# Patient Record
Sex: Female | Born: 1990 | Hispanic: Yes | Marital: Married | Smoking: Never smoker
Health system: Southern US, Community
[De-identification: ages and names within clinical notes are randomized; demographics above are authoritative.]

## PROBLEM LIST (undated history)

## (undated) ENCOUNTER — Inpatient Hospital Stay (HOSPITAL_COMMUNITY): Payer: Self-pay

---
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
## 2011-02-13 NOTE — Discharge Summary (Signed)
NAME:  Anna Perry, Anna Perry      ACCOUNT NO.:  [id number]   MEDICAL RECORD NO.:  [id number]          PATIENT TYPE:  INP   LOCATION:  9147                          FACILITY:  [facility]   PHYSICIAN:  Charles A. Clearance Coots, M.D.DATE OF BIRTH:  November 03, 1990   DATE OF ADMISSION:  06/08/2008  DATE OF DISCHARGE:  06/12/2008                               DISCHARGE SUMMARY   ADMITTING DIAGNOSIS:  Term pregnancy, early labor.   DISCHARGE DIAGNOSES:  1. Term pregnancy, early labor.  2. Status post primary low transverse cesarean section for arrest of      descent, deep transverse arrest, persistent occiput posterior      position, viable female delivered on June 09, 2008, at 1460,      Apgars of 8 at 1 minute and 9 at 5 minutes, weight of 3420 g,      length of 54.61 cm.  3. Mother and infant discharged home in good condition.   REASON FOR ADMISSION:  A 20 year old gravida 1 presented with  spontaneous uterine contractions.   PAST MEDICAL HISTORY:   SURGERY:  None.   ILLNESSES:  None.   MEDICATIONS:  Prenatal vitamins.   ALLERGIES:  No known drug allergies.   SOCIAL HISTORY:  Single.  Negative tobacco, alcohol, or recreational  drug use.   FAMILY HISTORY:  No major diseases.   PHYSICAL EXAMINATION:  GENERAL:  Well-nourished, well-developed female  in no acute distress.  Afebrile.  VITAL SIGNS:  Stable.  LUNGS:  Clear to auscultation bilaterally.  HEART:  Regular rate and rhythm.  ABDOMEN:  Gravid and nontender.  Cervix 4 cm, 100% effacement, vertex at  a 0 station.   ADMITTING LABS:  Hemoglobin 10, hematocrit 30, white blood cell count  9100, and platelets 285,000.  RPR is nonreactive.   HOSPITAL COURSE:  The patient was admitted and membranes were actively  ruptured with clear fluid expelled.  The patient progressed to full  dilatation and descent to +2 station, but had no further descent with  increased caput for greater than 3 hours and decision was made to  proceed with  cesarean section delivery for arrest of descent.  Primary  low transverse cesarean section was performed on June 09, 2008.  There were no intraoperative complications.  Postoperative course was  uncomplicated.  The patient did have anemia postoperatively, but had no  clinical hemodynamic adverse changes and was able to ambulate and get  around without any headache, dizziness, or weakness.  The patient was  therefore discharged home on postop day #3 in good condition.   DISCHARGE LABS:  Hemoglobin 7.5, hematocrit 22.6, white blood cell count  13,100, and platelets 204,000.   DISCHARGE DISPOSITION:  Continue prenatal vitamins.  Iron was prescribed  for anemia.  Tylox and ibuprofen was prescribed for pain.  Routine  written instructions were given for discharge after cesarean section.  The patient is to call office for followup appointment in 2 weeks.      Charles A. Clearance Coots, M.D.  Electronically Signed     CAH/MEDQ  D:  06/12/2008  T:  06/12/2008  Job:  [id number]

## 2011-02-13 NOTE — Op Note (Signed)
NAME:  Anna Perry, Anna Perry      ACCOUNT NO.:  [id number]   MEDICAL RECORD NO.:  [id number]          PATIENT TYPE:  INP   LOCATION:  9147                          FACILITY:  [facility]   PHYSICIAN:  Roseanna Rainbow, M.D.DATE OF BIRTH:  [birth year]   DATE OF PROCEDURE:  06/09/2008  DATE OF DISCHARGE:                               OPERATIVE REPORT   PREOPERATIVE DIAGNOSES:  Intrauterine pregnancy at term, arrest of  descent, deep transverse arrest.   POSTOPERATIVE DIAGNOSIS:  Persistent occiput posterior.   PROCEDURE:  Primary low uterine flap elliptical cesarean delivery via  Pfannenstiel skin incision.   SURGEON:  Roseanna Rainbow, MD   ANESTHESIA:  Epidural.   ESTIMATED BLOOD LOSS:  600 mL.  Please note that there was blood-tinged  urine prior to the start of the procedure and this was clearing at the  end of the procedure.   COMPLICATIONS:  There was extension of the uterine incision involving  the left inferolateral margin with delivery of the infant's head.   PROCEDURE:  The patient was taken to the operating room with an IV  running and an epidural catheter in situ.  She was placed in the dorsal  supine position with a leftward tilt and prepped and draped in the usual  sterile fashion.  After a time-out had been completed, a Pfannenstiel  skin incision was then made with a scalpel.  This was carried down to  the underlying fascia with the Bovie.  The fascia was incised in the  midline.  The fascial incision was then extended bilaterally with curved  Mayo scissors.  The superior aspect of the fascial incision was tented  up and the underlying rectus muscles dissected off.  The inferior aspect  of the fascial incision was manipulated in similar fashion.  The rectus  muscles were separated in the midline.  The parietal peritoneum was  tented up and entered sharply.  This incision was then extended  superiorly and inferiorly with good visualization of the bladder.   The  bladder blade was then placed.  The vesicouterine peritoneum was tented  up and entered sharply.  This incision was then extended bilaterally and  the bladder flap was created using blunt dissection.  The bladder blade  was then replaced.  The lower uterine segment was then incised in a  transverse fashion with a scalpel.  This incision was then extended  bluntly.  The infant's head was then delivered atraumatically with  upward elevation vaginally from an assistant.  The oropharynx was  suctioned with the bulb suction.  The cord was clamped and cut.  The  infant was handed off to the awaiting neonatologist.  The placenta was  then removed.  The intrauterine cavity was then evacuated of remaining  amniotic fluid, clots, and debris with moist laparotomy sponge.  The  uterine incision was then inspected with the extension noted above  extending down laterally to the vagina.  The extension was approximately  2 cm in length.  The bladder flap again was bluntly and sharply  developed.  The extension and uterine incision were closed using a  running interlocking suture of 0 Monocryl.  A second imbricating layer  of the same suture was then placed.  Individual bleeding points were  secured with figure-of-eight sutures of the same.  The paracolic gutters  were then irrigated.  The parietal peritoneum was then reapproximated in  a running fashion using  2-0 Vicryl.  The fascia was closed in a running fashion using 2 sutures  of 0 Vicryl.  The skin was closed with staples.  At the close of the  procedure, the instrument and pack counts were said to be correct x2.  2  g of cephazolin have been given at the start of the procedure.  The  patient was taken to the PACU, awake, and in stable condition.      Roseanna Rainbow, M.D.  Electronically Signed     LAJ/MEDQ  D:  06/09/2008  T:  06/10/2008  Job:  [id number]

## 2011-06-25 LAB — POCT PREGNANCY, URINE: Preg Test, Ur: POSITIVE

## 2011-07-04 LAB — RPR: RPR Ser Ql: NONREACTIVE

## 2011-07-04 LAB — CBC
HCT: 22.6 — ABNORMAL LOW
MCHC: 33.4
MCHC: 34.2
MCV: 86.6
RBC: 2.61 — ABNORMAL LOW
RBC: 3.61 — ABNORMAL LOW
RDW: 14.3
WBC: 13.1
WBC: 9.1

## 2012-08-07 ENCOUNTER — Emergency Department (HOSPITAL_COMMUNITY)
Admission: EM | Admit: 2012-08-07 | Discharge: 2012-08-07 | Disposition: A | Discharge status: Home / Self Care | Payer: Self-pay | Source: Home / Self Care | Attending: Emergency Medicine | Admitting: Emergency Medicine

## 2012-08-07 ENCOUNTER — Encounter (HOSPITAL_COMMUNITY): Payer: Self-pay | Admitting: Emergency Medicine

## 2012-08-07 DIAGNOSIS — N730 Acute parametritis and pelvic cellulitis: Secondary | ICD-10-CM

## 2012-08-07 LAB — WET PREP, GENITAL
Trich, Wet Prep: NONE SEEN
Yeast Wet Prep HPF POC: NONE SEEN

## 2012-08-07 LAB — POCT URINALYSIS DIP (DEVICE)
Bilirubin Urine: NEGATIVE
Nitrite: NEGATIVE
Specific Gravity, Urine: 1.015 (ref 1.005–1.030)
Urobilinogen, UA: 0.2 mg/dL (ref 0.0–1.0)
pH: 7 (ref 5.0–8.0)

## 2012-08-07 LAB — POCT PREGNANCY, URINE: Preg Test, Ur: NEGATIVE

## 2012-08-07 MED ORDER — LIDOCAINE HCL (PF) 1 % IJ SOLN
INTRAMUSCULAR | Status: AC
  Filled 2012-08-07: qty 5

## 2012-08-07 MED ORDER — AZITHROMYCIN 250 MG PO TABS
1000.0000 mg | ORAL_TABLET | Freq: Once | ORAL | Status: AC
  Administered 2012-08-07: 1000 mg via ORAL

## 2012-08-07 MED ORDER — METRONIDAZOLE 500 MG PO TABS: 500.0000 mg | ORAL_TABLET | Freq: Three times a day (TID) | ORAL | Status: DC

## 2012-08-07 MED ORDER — TRAMADOL HCL 50 MG PO TABS: 100.0000 mg | ORAL_TABLET | Freq: Three times a day (TID) | ORAL | Status: DC | PRN

## 2012-08-07 MED ORDER — CEFTRIAXONE SODIUM 250 MG IJ SOLR
250.0000 mg | Freq: Once | INTRAMUSCULAR | Status: AC
  Administered 2012-08-07: 250 mg via INTRAMUSCULAR

## 2012-08-07 MED ORDER — AZITHROMYCIN 250 MG PO TABS
ORAL_TABLET | ORAL | Status: AC
  Filled 2012-08-07: qty 4

## 2012-08-07 MED ORDER — CEFTRIAXONE SODIUM 250 MG IJ SOLR
INTRAMUSCULAR | Status: AC
  Filled 2012-08-07: qty 250

## 2012-08-07 NOTE — ED Notes (Signed)
Language barrier  Pt c/o pain at c-section site x 1 wk  Toward left ovary. Pain has become more severe over the past three days  Pt denies any other symptoms.

## 2012-08-07 NOTE — ED Provider Notes (Signed)
Chief Complaint  Patient presents with  . Pelvic Pain    pain at c section site. pt pointed to left side. pain x 1 wk more severe the past three days.    History of Present Illness:    The patient is a 21 year old female who presents tonight with a one-week history of left lower quadrant abdominal pain which radiates into her hip and towards the right lower quadrant as well. It's been worse for the past 3 days and is rated a 5/10 in intensity. It's worse if she eats and better temporarily with Aleve. She's felt somewhat chilled and had nausea. She denies any fever, vomiting, constipation, diarrhea, or blood in the stool. She denies urinary symptoms, discharge, itching, or abnormal menses. Last menstrual period was October 19. She is sexually active and has an Implanad for birth control. The patient also has a small bump on her left labia majora which is nontender. The patient had a C-section about 4 years ago. She has a C-section scar and the pain is localized over the area of the C-section scar. About 4 months ago the scar opened up and drained a small amount of fluid. It did not express any sutures. It is now closed and is not swollen, tender. She cannot feel a lump or mass. She denies any history of GYN complaints or STDs in the past. The patient is a [place] speaker and speaks little [place]. The history was obtained with the help of a telephone interpreter.  Review of Systems:  Other than noted above, the patient denies any of the following symptoms: Constitutional:  No fever, chills, fatigue, weight loss or anorexia. Lungs:  No cough or shortness of breath. Heart:  No chest pain, palpitations, syncope or edema.  No cardiac history. Abdomen:  No nausea, vomiting, hematememesis, melena, diarrhea, or hematochezia. GU:  No dysuria, frequency, urgency, or hematuria. Gyn:  No vaginal discharge, itching, abnormal bleeding, dyspareunia, or pelvic pain.  PMFSH:  Past medical history, family history,  social history, meds, and allergies were reviewed along with nurse's notes.  No prior abdominal surgeries, past history of GI problems, STDs or GYN problems.  No history of aspirin or NSAID use.  No excessive alcohol intake.  Physical Exam:   Vital signs:  BP 108/67  Pulse 67  Temp 98.6 F (37 C) (Oral)  Resp 18  SpO2 100%  LMP 07/07/2012 Gen:  Alert, oriented, in no distress. Lungs:  Breath sounds clear and equal bilaterally.  No wheezes, rales or rhonchi. Heart:  Regular rhythm.  No gallops or murmers.   Abdomen:  Abdomen is soft, flat, nondistended. She has a well-healed C-section scar which does not appear inflamed or erythematous. There is no obvious hernia or lump palpable. There is tenderness to palpation in the left lower quadrant without guarding or rebound. No organomegaly or mass. Bowel sounds are normally active. Pelvic:  Normal external genitalia with the exception of a small cystic lesion on the left labia majora. This was just a few millimeters in size, and was nontender. She has a copious, frothy, yellow discharge in the vagina and coming from the cervical os. There was tenderness on movement of the cervix and also tenderness over the uterus and the left adnexa without any adnexal mass. The uterus was not palpably enlarged. Skin:  Clear, warm and dry.  No rash.  Labs:   Results for orders placed during the hospital encounter of 08/07/12  POCT PREGNANCY, URINE      Component Value  Range   Preg Test, Ur NEGATIVE  NEGATIVE  POCT URINALYSIS DIP (DEVICE)      Component Value Range   Glucose, UA NEGATIVE  NEGATIVE mg/dL   Bilirubin Urine NEGATIVE  NEGATIVE   Ketones, ur NEGATIVE  NEGATIVE mg/dL   Specific Gravity, Urine 1.015  1.005 - 1.030   Hgb urine dipstick SMALL (*) NEGATIVE   pH 7.0  5.0 - 8.0   Protein, ur NEGATIVE  NEGATIVE mg/dL   Urobilinogen, UA 0.2  0.0 - 1.0 mg/dL   Nitrite NEGATIVE  NEGATIVE   Leukocytes, UA SMALL (*) NEGATIVE    Course in Urgent Care  Center:   She was given Rocephin 1 g IM and azithromycin 1000 mg by mouth and tolerated this well without any immediate side effects. GC and Chlamydia DNA probe and wet prep were also obtained and results are pending at this time. We will call the patient back about any positive results.  Assessment:  The encounter diagnosis was PID (acute pelvic inflammatory disease).  Plan:   1.  The following meds were prescribed:   New Prescriptions   METRONIDAZOLE (FLAGYL) 500 MG TABLET    Take 1 tablet (500 mg total) by mouth 3 (three) times daily.   TRAMADOL (ULTRAM) 50 MG TABLET    Take 2 tablets (100 mg total) by mouth every 8 (eight) hours as needed for pain.   2.  The patient was instructed in symptomatic care and handouts were given. 3.  The patient was told to return if becoming worse in any way, if no better in 3 or 4 days, and given some red flag symptoms that would indicate earlier return.  Follow up:  The patient was told to follow up with Dr. Erin Fulling in 2 weeks.     Reuben Likes, MD 08/07/12 2046

## 2012-08-08 ENCOUNTER — Emergency Department (HOSPITAL_COMMUNITY)
Admission: EM | Admit: 2012-08-08 | Discharge: 2012-08-08 | Disposition: A | Discharge status: Home / Self Care | Payer: Self-pay | Attending: Emergency Medicine | Admitting: Emergency Medicine

## 2012-08-08 ENCOUNTER — Encounter (HOSPITAL_COMMUNITY): Payer: Self-pay | Admitting: Emergency Medicine

## 2012-08-08 ENCOUNTER — Emergency Department (HOSPITAL_COMMUNITY)
Admission: EM | Admit: 2012-08-08 | Discharge: 2012-08-09 | Disposition: A | Discharge status: Home / Self Care | Payer: Self-pay

## 2012-08-08 DIAGNOSIS — R51 Headache: Secondary | ICD-10-CM | POA: Insufficient documentation

## 2012-08-08 DIAGNOSIS — R300 Dysuria: Secondary | ICD-10-CM | POA: Insufficient documentation

## 2012-08-08 DIAGNOSIS — R5381 Other malaise: Secondary | ICD-10-CM | POA: Insufficient documentation

## 2012-08-08 DIAGNOSIS — T50905A Adverse effect of unspecified drugs, medicaments and biological substances, initial encounter: Secondary | ICD-10-CM

## 2012-08-08 DIAGNOSIS — E860 Dehydration: Secondary | ICD-10-CM | POA: Insufficient documentation

## 2012-08-08 DIAGNOSIS — Y939 Activity, unspecified: Secondary | ICD-10-CM | POA: Insufficient documentation

## 2012-08-08 DIAGNOSIS — R109 Unspecified abdominal pain: Secondary | ICD-10-CM | POA: Insufficient documentation

## 2012-08-08 DIAGNOSIS — Z888 Allergy status to other drugs, medicaments and biological substances status: Secondary | ICD-10-CM | POA: Insufficient documentation

## 2012-08-08 DIAGNOSIS — R110 Nausea: Secondary | ICD-10-CM | POA: Insufficient documentation

## 2012-08-08 DIAGNOSIS — N949 Unspecified condition associated with female genital organs and menstrual cycle: Secondary | ICD-10-CM | POA: Insufficient documentation

## 2012-08-08 DIAGNOSIS — Y929 Unspecified place or not applicable: Secondary | ICD-10-CM | POA: Insufficient documentation

## 2012-08-08 LAB — URINE MICROSCOPIC-ADD ON

## 2012-08-08 LAB — URINALYSIS, ROUTINE W REFLEX MICROSCOPIC
Glucose, UA: NEGATIVE mg/dL
Hgb urine dipstick: NEGATIVE
Ketones, ur: NEGATIVE mg/dL
Protein, ur: NEGATIVE mg/dL

## 2012-08-08 LAB — POCT I-STAT, CHEM 8
Glucose, Bld: 131 mg/dL — ABNORMAL HIGH (ref 70–99)
HCT: 33 % — ABNORMAL LOW (ref 36.0–46.0)
Hemoglobin: 11.2 g/dL — ABNORMAL LOW (ref 12.0–15.0)
Potassium: 3.4 mEq/L — ABNORMAL LOW (ref 3.5–5.1)
Sodium: 139 mEq/L (ref 135–145)
TCO2: 19 mmol/L (ref 0–100)

## 2012-08-08 LAB — GC/CHLAMYDIA PROBE AMP, GENITAL: GC Probe Amp, Genital: NEGATIVE

## 2012-08-08 MED ORDER — SODIUM CHLORIDE 0.9 % IV BOLUS (SEPSIS)
1000.0000 mL | Freq: Once | INTRAVENOUS | Status: AC
  Administered 2012-08-08: 1000 mL via INTRAVENOUS

## 2012-08-08 MED ORDER — ONDANSETRON HCL 4 MG PO TABS: 4.0000 mg | ORAL_TABLET | Freq: Three times a day (TID) | ORAL | Status: DC | PRN

## 2012-08-08 MED ORDER — KETOROLAC TROMETHAMINE 30 MG/ML IJ SOLN
30.0000 mg | Freq: Once | INTRAMUSCULAR | Status: AC
  Administered 2012-08-08: 30 mg via INTRAVENOUS
  Filled 2012-08-08: qty 1

## 2012-08-08 MED ORDER — AZITHROMYCIN 250 MG PO TABS: 1000.0000 mg | ORAL_TABLET | Freq: Once | ORAL | Status: DC

## 2012-08-08 MED ORDER — METOCLOPRAMIDE HCL 5 MG/ML IJ SOLN
10.0000 mg | Freq: Once | INTRAMUSCULAR | Status: AC
  Administered 2012-08-08: 10 mg via INTRAVENOUS
  Filled 2012-08-08: qty 2

## 2012-08-08 NOTE — ED Notes (Signed)
Pt was just discharged from ED and was called and told to return to have additional blood work per Dr. Radford Pax.

## 2012-08-08 NOTE — ED Notes (Signed)
MD at bedside giving verbal instruction via Spanish.  Patient/family verbal understanding.

## 2012-08-08 NOTE — ED Notes (Signed)
Was seen yesterday for infection in vag arae and today she has vomited and she is dizzy

## 2012-08-08 NOTE — ED Notes (Signed)
Pt has family friend translating from [place] to [place]. Friend name is Marisue Ivan.

## 2012-08-08 NOTE — ED Notes (Signed)
Wet prep shows WBC report TNTC; GC, Chlamydia pending; treated w zithromax 1 grm and rocephin 250mg  in [facility] on day of visit, w rx for flagyl and tramadol

## 2012-08-08 NOTE — ED Provider Notes (Signed)
History     CSN: [id number]  Arrival date & time 08/08/12  1205   First MD Initiated Contact with Patient 08/08/12 1629      No chief complaint on file.   (Consider location/radiation/quality/duration/timing/severity/associated sxs/prior treatment) HPI Comments: Pt p/w weakness and lightheadedness since yesterday. She was seen yesterday in clinic and diagnosed with pelvic inflammatory disease, given 1 g of azithromycin, intramuscular ceftriaxone, and a prescription for Flagyl. After leaving the clinic yesterday evening she develop worsening lightheadedness and nausea. She has not vomited, she has not had syncope. She states her lightheadedness is positional, worse when sitting or standing up. She also has a frontal headache that was gradual onset and associated with her lightheadedness. Denies any other neurological complaints.  Patient is a 21 y.o. female presenting with weakness. The history is provided by the patient. No language interpreter was used.  Weakness The primary symptoms include headaches, dizziness and nausea. Primary symptoms do not include syncope, loss of consciousness, seizures, visual change, paresthesias, fever or vomiting. The symptoms began yesterday. The episode lasted 1 day. The symptoms are worsening. The neurological symptoms are diffuse. The symptoms occurred after standing up.  The headache began yesterday. Headache is a new problem. The headache is present frequently. The pain from the headache is at a severity of 6/10. Location/region(s) of the headache: frontal. The headache is associated with weakness. The headache is not associated with aura, photophobia, eye pain, visual change, neck stiffness, paresthesias or loss of balance.  She describes the dizziness as faintness and lightheadedness. The dizziness began yesterday. The dizziness has been gradually worsening since its onset. It is a new problem. The dizziness is associated with a recent illness and medication  use. Dizziness also occurs with nausea and weakness. Dizziness does not occur with vomiting.  Nausea began yesterday.  Additional symptoms include weakness. Additional symptoms do not include neck stiffness, loss of balance, photophobia or aura. Medical issues do not include seizures, cerebral vascular accident or cancer.    No past medical history on file.  Past Surgical History  Procedure Date  . Cesarean section     No family history on file.  History  Substance Use Topics  . Smoking status: Never Smoker   . Smokeless tobacco: Not on file  . Alcohol Use: No    OB History    Grav Para Term Preterm Abortions TAB SAB Ect Mult Living                  Review of Systems  Constitutional: Positive for appetite change (decr). Negative for fever, chills and activity change.  HENT: Negative for congestion, rhinorrhea, neck pain, neck stiffness and sinus pressure.   Eyes: Negative for photophobia, pain, discharge and visual disturbance.  Respiratory: Negative for cough, chest tightness, shortness of breath, wheezing and stridor.   Cardiovascular: Negative for chest pain, leg swelling and syncope.  Gastrointestinal: Positive for nausea and abdominal pain. Negative for vomiting, diarrhea and abdominal distention.  Genitourinary: Positive for dysuria and pelvic pain (lower abd and pelv pain for several days). Negative for decreased urine volume and difficulty urinating.  Musculoskeletal: Negative for back pain and arthralgias.  Skin: Negative for color change and pallor.  Neurological: Positive for dizziness, weakness and headaches. Negative for seizures, loss of consciousness, light-headedness, paresthesias and loss of balance.  Psychiatric/Behavioral: Negative for behavioral problems and agitation.  All other systems reviewed and are negative.    Allergies  Review of patient's allergies indicates no known allergies.  Home  Medications   Current Outpatient Rx  Name  Route  Sig   Dispense  Refill  . METRONIDAZOLE 500 MG PO TABS   Oral   Take 1 tablet (500 mg total) by mouth 3 (three) times daily.   42 tablet   0   . TRAMADOL HCL 50 MG PO TABS   Oral   Take 2 tablets (100 mg total) by mouth every 8 (eight) hours as needed for pain.   30 tablet   0     BP 86/57  Pulse 61  Temp 97.7 F (36.5 C) (Oral)  Resp 16  Ht 5\' 5"  (1.651 m)  Wt 180 lb (81.647 kg)  BMI 29.95 kg/m2  SpO2 98%  LMP 07/07/2012  Physical Exam  Nursing note and vitals reviewed. Constitutional: She is oriented to person, place, and time. She appears well-developed and well-nourished. No distress.  HENT:  Head: Normocephalic and atraumatic.  Mouth/Throat: No oropharyngeal exudate.  Eyes: EOM are normal. Pupils are equal, round, and reactive to light. Right eye exhibits no discharge. Left eye exhibits no discharge.  Neck: Normal range of motion. Neck supple. No JVD present.  Cardiovascular: Normal rate, regular rhythm and normal heart sounds.   Pulmonary/Chest: Effort normal and breath sounds normal. No stridor. No respiratory distress. She exhibits no tenderness.  Abdominal: Soft. Bowel sounds are normal. She exhibits no distension and no mass. There is tenderness (mild sp and LLQ). There is no rebound and no guarding.  Musculoskeletal: Normal range of motion. She exhibits no edema and no tenderness.  Neurological: She is alert and oriented to person, place, and time. No cranial nerve deficit. She exhibits normal muscle tone.  Skin: Skin is warm and dry. No rash noted. She is not diaphoretic.  Psychiatric: She has a normal mood and affect. Her behavior is normal. Judgment and thought content normal.    ED Course  Procedures (including critical care time)  Labs Reviewed  URINALYSIS, ROUTINE W REFLEX MICROSCOPIC - Abnormal; Notable for the following:    APPearance CLOUDY (*)     Leukocytes, UA SMALL (*)     All other components within normal limits  URINE MICROSCOPIC-ADD ON -  Abnormal; Notable for the following:    Squamous Epithelial / LPF FEW (*)     All other components within normal limits   iStat hgb 11  No results found.   1. Dehydration   2. Medication reaction       MDM  Patient presents with lightheadedness, weakness, nausea since yesterday. She was hypotensive initially when I saw her her blood pressure was normal. sHe was given 1 L of normal saline and felt much better afterwards, back to her baseline. Benign abdominal exam, otherwise well-appearing. Her anemia in 2011 was noted after she had been discharged so she was called back and was checked, it was consistent with mild anemia with a hemoglobin of 11, not concerning since it had previously been down as low as 7 in the past. Doubt CNS cause for symptoms, infectious process, blood loss. More likely related to dehydration and possible side effect from Flagyl since this can cause GI upset. Pt deemed stable for discharge. Return precautions were provided and pt expressed understanding to return to ED if any acute symptoms return. Follow up was instructed which pt also expressed understanding. All questions were answered and pt was in agreement w/ plan.     Warrick Parisian, MD 08/09/12 0002

## 2012-08-09 NOTE — ED Provider Notes (Signed)
I reviewed the resident's note and I agree with the findings and plan.     Nelia Shi, MD 08/09/12 [phone]

## 2016-02-04 ENCOUNTER — Encounter (HOSPITAL_COMMUNITY): Payer: Self-pay | Admitting: *Deleted

## 2016-02-04 ENCOUNTER — Encounter (HOSPITAL_COMMUNITY): Payer: Self-pay | Admitting: Emergency Medicine

## 2016-02-04 ENCOUNTER — Inpatient Hospital Stay (HOSPITAL_COMMUNITY)
Admission: EM | Admit: 2016-02-04 | Discharge: 2016-02-07 | DRG: 872 | Disposition: A | Discharge status: Home / Self Care | Payer: Self-pay | Attending: Internal Medicine | Admitting: Internal Medicine

## 2016-02-04 ENCOUNTER — Ambulatory Visit (HOSPITAL_COMMUNITY)
Admission: EM | Admit: 2016-02-04 | Discharge: 2016-02-04 | Disposition: A | Discharge status: Home / Self Care | Payer: Self-pay | Attending: Family Medicine | Admitting: Family Medicine

## 2016-02-04 DIAGNOSIS — R112 Nausea with vomiting, unspecified: Secondary | ICD-10-CM

## 2016-02-04 DIAGNOSIS — R000 Tachycardia, unspecified: Secondary | ICD-10-CM | POA: Diagnosis present

## 2016-02-04 DIAGNOSIS — I951 Orthostatic hypotension: Secondary | ICD-10-CM

## 2016-02-04 DIAGNOSIS — N12 Tubulo-interstitial nephritis, not specified as acute or chronic: Secondary | ICD-10-CM

## 2016-02-04 DIAGNOSIS — N10 Acute tubulo-interstitial nephritis: Secondary | ICD-10-CM | POA: Diagnosis present

## 2016-02-04 DIAGNOSIS — E860 Dehydration: Secondary | ICD-10-CM

## 2016-02-04 DIAGNOSIS — A419 Sepsis, unspecified organism: Principal | ICD-10-CM | POA: Diagnosis present

## 2016-02-04 DIAGNOSIS — R509 Fever, unspecified: Secondary | ICD-10-CM

## 2016-02-04 DIAGNOSIS — R9389 Abnormal findings on diagnostic imaging of other specified body structures: Secondary | ICD-10-CM

## 2016-02-04 DIAGNOSIS — E876 Hypokalemia: Secondary | ICD-10-CM | POA: Diagnosis present

## 2016-02-04 DIAGNOSIS — B9620 Unspecified Escherichia coli [E. coli] as the cause of diseases classified elsewhere: Secondary | ICD-10-CM | POA: Diagnosis present

## 2016-02-04 DIAGNOSIS — Z791 Long term (current) use of non-steroidal anti-inflammatories (NSAID): Secondary | ICD-10-CM

## 2016-02-04 DIAGNOSIS — D649 Anemia, unspecified: Secondary | ICD-10-CM | POA: Diagnosis present

## 2016-02-04 DIAGNOSIS — Z79899 Other long term (current) drug therapy: Secondary | ICD-10-CM

## 2016-02-04 LAB — BASIC METABOLIC PANEL
ANION GAP: 9 (ref 5–15)
BUN: 9 mg/dL (ref 6–20)
CO2: 22 mmol/L (ref 22–32)
Calcium: 8.8 mg/dL — ABNORMAL LOW (ref 8.9–10.3)
Chloride: 108 mmol/L (ref 101–111)
Creatinine, Ser: 0.69 mg/dL (ref 0.44–1.00)
GFR calc Af Amer: >60 mL/min (ref >60)
GLUCOSE: 123 mg/dL — AB (ref 65–99)
POTASSIUM: 3.1 mmol/L — AB (ref 3.5–5.1)
Sodium: 139 mmol/L (ref 135–145)

## 2016-02-04 LAB — POCT URINALYSIS DIP (DEVICE)
BILIRUBIN URINE: NEGATIVE
GLUCOSE, UA: NEGATIVE mg/dL
KETONES UR: NEGATIVE mg/dL
Nitrite: POSITIVE — AB
Protein, ur: 100 mg/dL — AB
Specific Gravity, Urine: 1.015 (ref 1.005–1.030)
Urobilinogen, UA: 1 mg/dL (ref 0.0–1.0)
pH: 6.5 (ref 5.0–8.0)

## 2016-02-04 LAB — URINALYSIS, ROUTINE W REFLEX MICROSCOPIC
Bilirubin Urine: NEGATIVE
GLUCOSE, UA: NEGATIVE mg/dL
Ketones, ur: NEGATIVE mg/dL
Nitrite: POSITIVE — AB
PH: 6 (ref 5.0–8.0)
PROTEIN: 30 mg/dL — AB
Specific Gravity, Urine: 1.018 (ref 1.005–1.030)

## 2016-02-04 LAB — CBC
HEMATOCRIT: 31.9 % — AB (ref 36.0–46.0)
HEMOGLOBIN: 10.9 g/dL — AB (ref 12.0–15.0)
MCH: 29.5 pg (ref 26.0–34.0)
MCHC: 34.2 g/dL (ref 30.0–36.0)
MCV: 86.2 fL (ref 78.0–100.0)
Platelets: 291 10*3/uL (ref 150–400)
RBC: 3.7 MIL/uL — ABNORMAL LOW (ref 3.87–5.11)
RDW: 12.3 % (ref 11.5–15.5)
WBC: 11.3 10*3/uL — ABNORMAL HIGH (ref 4.0–10.5)

## 2016-02-04 LAB — URINE MICROSCOPIC-ADD ON

## 2016-02-04 LAB — I-STAT CG4 LACTIC ACID, ED: LACTIC ACID, VENOUS: 0.89 mmol/L (ref 0.5–2.0)

## 2016-02-04 LAB — POC URINE PREG, ED: Preg Test, Ur: NEGATIVE

## 2016-02-04 LAB — POCT PREGNANCY, URINE: PREG TEST UR: NEGATIVE

## 2016-02-04 MED ORDER — SODIUM CHLORIDE 0.9 % IV BOLUS (SEPSIS)
1000.0000 mL | Freq: Once | INTRAVENOUS | Status: AC
  Administered 2016-02-04: 1000 mL via INTRAVENOUS

## 2016-02-04 MED ORDER — ACETAMINOPHEN 500 MG PO TABS
1000.0000 mg | ORAL_TABLET | Freq: Once | ORAL | Status: AC
  Administered 2016-02-04: 1000 mg via ORAL
  Filled 2016-02-04: qty 2

## 2016-02-04 MED ORDER — CEFTRIAXONE SODIUM 1 G IJ SOLR
1.0000 g | Freq: Once | INTRAMUSCULAR | Status: AC
  Administered 2016-02-05: 1 g via INTRAVENOUS
  Filled 2016-02-04: qty 10

## 2016-02-04 MED ORDER — ONDANSETRON 4 MG PO TBDP
ORAL_TABLET | ORAL | Status: AC
  Filled 2016-02-04: qty 1

## 2016-02-04 MED ORDER — ONDANSETRON 4 MG PO TBDP
4.0000 mg | ORAL_TABLET | Freq: Once | ORAL | Status: AC
  Administered 2016-02-04: 4 mg via ORAL

## 2016-02-04 NOTE — ED Notes (Signed)
C/o back pain onset x3 days but increases w/in the last hour Sx also include left flank pain, n/v, HA, chills... Pt is shaking; accurate BP was difficult to obtain Also reports foul urine smell and dysuria Husband at bedside... A&O x4... No acute distress.

## 2016-02-04 NOTE — ED Notes (Signed)
Patient presents from Urgent Care with c/o left flank pain that radiates around to the lower abd, dysuria with foul smelling urine.

## 2016-02-04 NOTE — ED Provider Notes (Signed)
CSN: [id number]     Arrival date & time 02/04/16  2003 History   First MD Initiated Contact with Patient 02/04/16 2033     Chief Complaint  Patient presents with  . Flank Pain  . Fever  . Dysuria     (Consider location/radiation/quality/duration/timing/severity/associated sxs/prior Treatment) Patient is a 25 y.o. female presenting with fever and dysuria. The history is provided by the patient, medical records and the spouse. The history is limited by a language barrier. A language interpreter was used.  Fever Associated symptoms: chills, dysuria, nausea and vomiting   Associated symptoms: no chest pain, no cough, no diarrhea, no headaches and no rash   Dysuria Associated symptoms: fever ( subjective), flank pain, nausea and vomiting   Associated symptoms: no abdominal pain     Anna Perry is a 25 y.o. female  with a hx of Cesarean section presents to the Emergency Department from [facility] urgent care complaining of gradual, persistent, progressively worsening flank and left lower quadrant abdominal pain onset 3 days ago. Associated symptoms include nausea, lightheadedness, generalized headache, subjective fevers and rigors. Patient also and endorses dysuria, urinary frequency and malodorous urine.  One episode of NBNB emesis while at urgent care.  No treatments prior to arrival. No aggravating or alleviating factors.  She reports history of UTI with similar left lower quadrant abdominal pain but no left flank pain.   Pt denies neck pain, neck stiffness, chest pain, shortness of breath, diarrhea, syncope.  LMP: now  No primary care.  History reviewed. No pertinent past medical history. Past Surgical History  Procedure Laterality Date  . Cesarean section     No family history on file. Social History  Substance Use Topics  . Smoking status: Never Smoker   . Smokeless tobacco: None  . Alcohol Use: No   OB History    No data available     Review of Systems   Constitutional: Positive for fever ( subjective) and chills. Negative for diaphoresis, appetite change, fatigue and unexpected weight change.  HENT: Negative for mouth sores.   Eyes: Negative for visual disturbance.  Respiratory: Negative for cough, chest tightness, shortness of breath and wheezing.   Cardiovascular: Negative for chest pain.  Gastrointestinal: Positive for nausea and vomiting. Negative for abdominal pain, diarrhea and constipation.  Endocrine: Negative for polydipsia, polyphagia and polyuria.  Genitourinary: Positive for dysuria, frequency and flank pain. Negative for urgency and hematuria.  Musculoskeletal: Negative for back pain and neck stiffness.  Skin: Negative for rash.  Allergic/Immunologic: Negative for immunocompromised state.  Neurological: Positive for weakness (generalized). Negative for syncope, light-headedness and headaches.  Hematological: Does not bruise/bleed easily.  Psychiatric/Behavioral: Negative for sleep disturbance. The patient is not nervous/anxious.       Allergies  Review of patient's allergies indicates no known allergies.  Home Medications   Prior to Admission medications   Medication Sig Start Date End Date Taking? Authorizing Provider  naproxen sodium (ALEVE) 220 MG tablet Take 220 mg by mouth daily as needed. For pain   Yes Historical Provider, MD  prenatal vitamin w/FE, FA (PRENATAL 1 + 1) 27-1 MG TABS tablet Take 1 tablet by mouth daily at 12 noon.   Yes Historical Provider, MD  azithromycin (ZITHROMAX) 250 MG tablet Take 4 tablets (1,000 mg total) by mouth once. Take first 2 tablets together, then 1 every day until finished. Patient not taking: Reported on 02/04/2016 08/14/12   Warrick Parisian, MD  ondansetron (ZOFRAN) 4 MG tablet Take 1 tablet (4  mg total) by mouth every 8 (eight) hours as needed for nausea. Patient not taking: Reported on 02/04/2016 08/08/12   Warrick ParisianJackson Henley, MD  traMADol (ULTRAM) 50 MG tablet Take 2 tablets (100 mg  total) by mouth every 8 (eight) hours as needed for pain. Patient not taking: Reported on 02/04/2016 08/07/12   Reuben Likesavid C Keller, MD   BP 90/61 mmHg  Pulse 80  Temp(Src) 101 F (38.3 C) (Rectal)  Resp 20  Wt 81.647 kg  SpO2 99%  LMP 02/04/2016 Physical Exam  Constitutional: She appears well-developed and well-nourished. No distress.  HENT:  Head: Normocephalic and atraumatic.  Mouth/Throat: Oropharynx is clear and moist. No oropharyngeal exudate.  Cardiovascular: Regular rhythm, normal heart sounds and intact distal pulses.  Tachycardia present.   Pulses:      Radial pulses are 2+ on the right side, and 2+ on the left side.       Dorsalis pedis pulses are 2+ on the right side, and 2+ on the left side.  Pulmonary/Chest: Effort normal and breath sounds normal. No respiratory distress. She has no wheezes.  Abdominal: Soft. Bowel sounds are normal. She exhibits no distension and no mass. There is no hepatosplenomegaly. There is tenderness in the suprapubic area and left lower quadrant. There is CVA tenderness (Left). There is no rebound and no guarding.  Musculoskeletal: Normal range of motion. She exhibits no edema.  Neurological: She is alert.  Skin: Skin is warm and dry. No rash noted. She is not diaphoretic.  Psychiatric: She has a normal mood and affect.  Nursing note and vitals reviewed.   ED Course  Procedures (including critical care time) Labs Review Labs Reviewed  URINALYSIS, ROUTINE W REFLEX MICROSCOPIC (NOT AT [facility]) - Abnormal; Notable for the following:    APPearance CLOUDY (*)    Hgb urine dipstick LARGE (*)    Protein, ur 30 (*)    Nitrite POSITIVE (*)    Leukocytes, UA MODERATE (*)    All other components within normal limits  BASIC METABOLIC PANEL - Abnormal; Notable for the following:    Potassium 3.1 (*)    Glucose, Bld 123 (*)    Calcium 8.8 (*)    All other components within normal limits  CBC - Abnormal; Notable for the following:    WBC 11.3 (*)    RBC  3.70 (*)    Hemoglobin 10.9 (*)    HCT 31.9 (*)    All other components within normal limits  URINE MICROSCOPIC-ADD ON - Abnormal; Notable for the following:    Squamous Epithelial / LPF 0-5 (*)    Bacteria, UA MANY (*)    All other components within normal limits  URINE CULTURE  POC URINE PREG, ED  I-STAT CG4 LACTIC ACID, ED     MDM   Final diagnoses:  Pyelonephritis  Orthostatic hypotension  Fever, unspecified fever cause   Latima Perry presents with Fever, tachycardia, flank pain.  Urinalysis shows urinary tract infection. Patient with pyelonephritis. Patient with persistent orthostatic hypotension after 2 L of fluids. She reports she continues to feel somewhat dizzy. Labs are reassuring.  Leukocytosis of 11.3 but normal lactic acid. Hypokalemia at 3.1, repleted here in the department.  She given Rocephin.  She continues to feel poorly. Will admit for further fluid resuscitation and antibiotics.  Patient does have blood pressure of 71/58 documented during orthostatic vital signs however blood pressure taken just prior to this was 90/60. This low reading with SPB in the 70s was not  rechecked and I doubt the validity of it.  Tachycardia has resolved.    Orthostatic VS for the past 24 hrs:  BP- Lying Pulse- Lying BP- Sitting Pulse- Sitting BP- Standing at 0 minutes Pulse- Standing at 0 minutes  02/04/16 2354 (!) 71/58 mmHg 79 94/60 mmHg 80 90/61 mmHg 92    12:58 AM Discussed with Dr. Clyde Lundborg who will admit to tele obs.    Dahlia Client Irish Breisch, PA-C 02/05/16 [id number]  Benjiman Core, MD 02/05/16 [phone]

## 2016-02-04 NOTE — ED Provider Notes (Signed)
CSN: [id number]     Arrival date & time 02/04/16  1801 History   First MD Initiated Contact with Patient 02/04/16 1834     Chief Complaint  Patient presents with  . Urinary Tract Infection  . Back Pain   (Consider location/radiation/quality/duration/timing/severity/associated sxs/prior Treatment) HPI Comments: 25 year old spent female presents to the urgent care with a 3 day history of left lower back pain. Pain to the left flank that radiates to the left lower quadrant, headache, dizziness, weakness, nausea and vomiting and Reiter's. Positive for subjective fever not measured at home. She states in the past couple days she has had a malodorous urine, mild dysuria and mild frequency. It is noted that she is afebrile, tachycardic and orthostatic. She has vomited once since she has been in the urgent care. She received a Zofran and feels better in that respect.   History reviewed. No pertinent past medical history. Past Surgical History  Procedure Laterality Date  . Cesarean section     No family history on file. Social History  Substance Use Topics  . Smoking status: Never Smoker   . Smokeless tobacco: None  . Alcohol Use: No   OB History    No data available     Review of Systems  Constitutional: Positive for fever, chills, activity change and fatigue.  HENT: Negative.   Respiratory: Negative.  Negative for cough and shortness of breath.   Cardiovascular: Negative for chest pain.  Gastrointestinal: Positive for nausea, vomiting and abdominal pain.  Genitourinary: Positive for dysuria, frequency, hematuria and flank pain. Negative for vaginal discharge and pelvic pain.  Musculoskeletal: Positive for back pain. Negative for neck pain.  Skin: Negative.   Neurological: Negative.     Allergies  Review of patient's allergies indicates no known allergies.  Home Medications   Prior to Admission medications   Medication Sig Start Date End Date Taking? Authorizing Provider   azithromycin (ZITHROMAX) 250 MG tablet Take 4 tablets (1,000 mg total) by mouth once. Take first 2 tablets together, then 1 every day until finished. 08/14/12   Warrick ParisianJackson Henley, MD  ondansetron (ZOFRAN) 4 MG tablet Take 1 tablet (4 mg total) by mouth every 8 (eight) hours as needed for nausea. 08/08/12   Warrick ParisianJackson Henley, MD  traMADol (ULTRAM) 50 MG tablet Take 2 tablets (100 mg total) by mouth every 8 (eight) hours as needed for pain. 08/07/12   Reuben Likesavid C Keller, MD   Meds Ordered and Administered this Visit   Medications  ondansetron (ZOFRAN-ODT) disintegrating tablet 4 mg (4 mg Oral Given 02/04/16 1903)    BP 124/72 mmHg  Pulse 133  Temp(Src) 98.2 F (36.8 C) (Oral)  Resp 36  SpO2 99% Orthostatic VS for the past 24 hrs:  BP- Lying Pulse- Lying BP- Standing at 0 minutes Pulse- Standing at 0 minutes  02/04/16 1904 111/64 mmHg 115 98/67 mmHg 145    Physical Exam  Constitutional: She is oriented to person, place, and time. She appears well-developed and well-nourished. No distress.  Eyes: EOM are normal.  Neck: Normal range of motion. Neck supple.  Cardiovascular: Normal heart sounds.   No murmur heard. Tachycardia at 130.  Pulmonary/Chest: Effort normal. No respiratory distress. She has no wheezes.  Abdominal: Soft. Bowel sounds are normal. She exhibits no distension and no mass. There is no rebound.  There is tenderness to the left mid and left upper quadrant as well as the epigastrium. Tenderness to the left lateral most abdomen as well as the left flank.  Musculoskeletal: She exhibits tenderness. She exhibits no edema.  Neurological: She is alert and oriented to person, place, and time. She exhibits normal muscle tone.  Skin: Skin is warm and dry. No rash noted.  Psychiatric: She has a normal mood and affect.  Nursing note and vitals reviewed.   ED Course  Procedures (including critical care time)  Labs Review Labs Reviewed  POCT URINALYSIS DIP (DEVICE) - Abnormal; Notable for  the following:    Hgb urine dipstick MODERATE (*)    Protein, ur 100 (*)    Nitrite POSITIVE (*)    Leukocytes, UA MODERATE (*)    All other components within normal limits  POCT PREGNANCY, URINE    Imaging Review No results found.   Visual Acuity Review  Right Eye Distance:   Left Eye Distance:   Bilateral Distance:    Right Eye Near:   Left Eye Near:    Bilateral Near:         MDM   1. Pyelonephritis   2. Dehydration   3. Orthostasis   4. Non-intractable vomiting with nausea, vomiting of unspecified type    Patient is stable at this time however she is orthostatic, dehydrated with likely pyelonephritis, nausea and vomiting although improved with 1 dose of oral Zofran 4 mg. Transfer to [place] via shuttle.    Hayden Rasmussen, NP 02/04/16 1950

## 2016-02-05 ENCOUNTER — Observation Stay (HOSPITAL_COMMUNITY): Payer: Self-pay

## 2016-02-05 DIAGNOSIS — N12 Tubulo-interstitial nephritis, not specified as acute or chronic: Secondary | ICD-10-CM

## 2016-02-05 DIAGNOSIS — E876 Hypokalemia: Secondary | ICD-10-CM | POA: Diagnosis present

## 2016-02-05 DIAGNOSIS — A419 Sepsis, unspecified organism: Secondary | ICD-10-CM | POA: Diagnosis present

## 2016-02-05 LAB — BASIC METABOLIC PANEL
Anion gap: 8 (ref 5–15)
BUN: 5 mg/dL — AB (ref 6–20)
CALCIUM: 7.7 mg/dL — AB (ref 8.9–10.3)
CO2: 20 mmol/L — ABNORMAL LOW (ref 22–32)
CREATININE: 0.53 mg/dL (ref 0.44–1.00)
Chloride: 112 mmol/L — ABNORMAL HIGH (ref 101–111)
GFR calc Af Amer: >60 mL/min (ref >60)
Glucose, Bld: 101 mg/dL — ABNORMAL HIGH (ref 65–99)
POTASSIUM: 3.8 mmol/L (ref 3.5–5.1)
Sodium: 140 mmol/L (ref 135–145)

## 2016-02-05 LAB — CBC
HCT: 28.5 % — ABNORMAL LOW (ref 36.0–46.0)
Hemoglobin: 9.8 g/dL — ABNORMAL LOW (ref 12.0–15.0)
MCH: 30.2 pg (ref 26.0–34.0)
MCHC: 34.4 g/dL (ref 30.0–36.0)
MCV: 87.7 fL (ref 78.0–100.0)
PLATELETS: 240 10*3/uL (ref 150–400)
RBC: 3.25 MIL/uL — AB (ref 3.87–5.11)
RDW: 12.6 % (ref 11.5–15.5)
WBC: 9.7 10*3/uL (ref 4.0–10.5)

## 2016-02-05 LAB — PROCALCITONIN: PROCALCITONIN: 18.47 ng/mL

## 2016-02-05 LAB — PROTIME-INR
INR: 1.24 (ref 0.00–1.49)
PROTHROMBIN TIME: 15.8 s — AB (ref 11.6–15.2)

## 2016-02-05 LAB — LACTIC ACID, PLASMA: Lactic Acid, Venous: 0.6 mmol/L (ref 0.5–2.0)

## 2016-02-05 LAB — MAGNESIUM: MAGNESIUM: 1.6 mg/dL — AB (ref 1.7–2.4)

## 2016-02-05 LAB — APTT: aPTT: 30 seconds (ref 24–37)

## 2016-02-05 MED ORDER — OXYCODONE-ACETAMINOPHEN 5-325 MG PO TABS
2.0000 | ORAL_TABLET | Freq: Four times a day (QID) | ORAL | Status: DC | PRN
Start: 2016-02-05 — End: 2016-02-07
  Administered 2016-02-05 – 2016-02-06 (×3): 2 via ORAL
  Filled 2016-02-05 (×3): qty 2

## 2016-02-05 MED ORDER — DEXTROSE 5 % IV SOLN: 1.0000 g | INTRAVENOUS | Status: DC

## 2016-02-05 MED ORDER — ACETAMINOPHEN 650 MG RE SUPP: 650.0000 mg | Freq: Four times a day (QID) | RECTAL | Status: DC | PRN

## 2016-02-05 MED ORDER — SODIUM CHLORIDE 0.9% FLUSH
3.0000 mL | Freq: Two times a day (BID) | INTRAVENOUS | Status: DC
  Administered 2016-02-05 – 2016-02-06 (×3): 3 mL via INTRAVENOUS

## 2016-02-05 MED ORDER — NAPROXEN 250 MG PO TABS
250.0000 mg | ORAL_TABLET | Freq: Every day | ORAL | Status: DC | PRN
  Administered 2016-02-05: 250 mg via ORAL
  Filled 2016-02-05: qty 1

## 2016-02-05 MED ORDER — NAPROXEN SODIUM 220 MG PO TABS: 220.0000 mg | ORAL_TABLET | Freq: Every day | ORAL | Status: DC | PRN

## 2016-02-05 MED ORDER — PRENATAL PLUS 27-1 MG PO TABS
1.0000 | ORAL_TABLET | Freq: Every day | ORAL | Status: DC
  Administered 2016-02-05 – 2016-02-07 (×3): 1 via ORAL
  Filled 2016-02-05 (×3): qty 1

## 2016-02-05 MED ORDER — SODIUM CHLORIDE 0.9 % IV BOLUS (SEPSIS)
1000.0000 mL | Freq: Once | INTRAVENOUS | Status: AC
  Administered 2016-02-05: 1000 mL via INTRAVENOUS

## 2016-02-05 MED ORDER — MAGNESIUM SULFATE 2 GM/50ML IV SOLN
2.0000 g | Freq: Once | INTRAVENOUS | Status: AC
Start: 2016-02-05 — End: 2016-02-05
  Administered 2016-02-05: 2 g via INTRAVENOUS
  Filled 2016-02-05: qty 50

## 2016-02-05 MED ORDER — ENOXAPARIN SODIUM 40 MG/0.4ML ~~LOC~~ SOLN
40.0000 mg | SUBCUTANEOUS | Status: DC
  Administered 2016-02-05 – 2016-02-07 (×3): 40 mg via SUBCUTANEOUS
  Filled 2016-02-05 (×3): qty 0.4

## 2016-02-05 MED ORDER — SODIUM CHLORIDE 0.9 % IV SOLN
INTRAVENOUS | Status: AC
  Administered 2016-02-05 – 2016-02-06 (×2): via INTRAVENOUS

## 2016-02-05 MED ORDER — DEXTROSE 5 % IV SOLN
1.0000 g | INTRAVENOUS | Status: DC
  Administered 2016-02-05 – 2016-02-06 (×2): 1 g via INTRAVENOUS
  Filled 2016-02-05 (×3): qty 10

## 2016-02-05 MED ORDER — ACETAMINOPHEN 325 MG PO TABS: 650.0000 mg | ORAL_TABLET | Freq: Four times a day (QID) | ORAL | Status: DC | PRN

## 2016-02-05 MED ORDER — SODIUM CHLORIDE 0.9 % IV SOLN
INTRAVENOUS | Status: DC
  Administered 2016-02-05: 1000 mL via INTRAVENOUS
  Administered 2016-02-05: 13:00:00 via INTRAVENOUS

## 2016-02-05 MED ORDER — SODIUM CHLORIDE 0.9 % IV BOLUS (SEPSIS): 500.0000 mL | Freq: Once | INTRAVENOUS | Status: DC

## 2016-02-05 MED ORDER — POTASSIUM CHLORIDE CRYS ER 20 MEQ PO TBCR
40.0000 meq | EXTENDED_RELEASE_TABLET | Freq: Once | ORAL | Status: AC
  Administered 2016-02-05: 40 meq via ORAL
  Filled 2016-02-05: qty 2

## 2016-02-05 MED ORDER — ONDANSETRON HCL 4 MG/2ML IJ SOLN
4.0000 mg | Freq: Three times a day (TID) | INTRAMUSCULAR | Status: DC | PRN
  Administered 2016-02-05: 4 mg via INTRAVENOUS
  Filled 2016-02-05: qty 2

## 2016-02-05 NOTE — Progress Notes (Signed)
PROGRESS NOTE  Anna Perry  ZOX:[id number] DOB: [birth year]  DOA: 02/04/2016 PCP: Default, Provider, MD  Outpatient Specialists:  None known.  Brief Narrative:  25 year old Spanish-speaking female, no significant PMH, presented to [facility] ED on 02/04/16 with complaints of dysuria, fever and left flank pain. Hypotensive in the ED 71/58, lactate normal, negative urine pregnancy test, WBC 11.3, febrile 101, tachycardic, tachypneic, potassium 3.1, chest x-ray suggested atelectasis versus infiltration and lung bases. Admitted for suspected acute pyelonephritis. Improving.   Assessment & Plan:   Principal Problem:   Pyelonephritis Active Problems:   Sepsis (HCC)   Hypokalemia   Sepsis secondary to acute pyelonephritis - Sepsis features present on admission. - Continue IV fluids and IV Rocephin pending urine and blood culture results. - Improved. - Although chest x-ray suggests atelectasis or infiltration in lung bases, no respiratory symptoms to suggest pneumonia. Probably atelectasis. Incentive spirometry. Follow chest x-ray in a.m.  Hypokalemia - Replaced.  Hypomagnesemia - Replace and follow up.  Anemia - Possibly chronic. Follow CBC in a.m.   DVT prophylaxis: Lovenox Code Status: Full Family Communication: Discussed with patient's spouse at bedside Disposition Plan: DC home, possibly in 48 hours.   Consultants:   None  Procedures:   None  Antimicrobials:   IV Rocephin 5/6 >    Subjective: Feels better. Complains of mild left-sided abdominal pain. Denies any other complaints. Patient was interviewed using telephone interpreter.  Objective:  Filed Vitals:   02/05/16 0015 02/05/16 0030 02/05/16 0159 02/05/16 0548  BP: 91/54  Pulse: 87 87 84 82  Temp:   97.8 F (36.6 C) 98.2 F (36.8 C)  TempSrc:   Oral Oral  Resp:   16 15  Height:   5' (1.524 m)   Weight:   85.049 kg (187 lb 8 oz)   SpO2: 99% 98% 99% 99%    Intake/Output Summary  (Last 24 hours) at 02/05/16 1504 Last data filed at 02/05/16 0600  Gross per 24 hour  Intake 143.75 ml  Output      0 ml  Net 143.75 ml   Filed Weights   02/04/16 2021 02/05/16 0159  Weight: 81.647 kg (180 lb) 85.049 kg (187 lb 8 oz)    Examination:  General exam: Pleasant young female lying comfortably propped up in bed. Does not appear septic or toxic currently. Appears calm and comfortable  Respiratory system: Clear to auscultation. Respiratory effort normal. Cardiovascular system: S1 & S2 heard, RRR.Marland Kitchen No JVD, murmurs, rubs, gallops or clicks. No pedal edema. Telemetry: Sinus rhythm. Gastrointestinal system: Abdomen is nondistended, soft and nontender. No organomegaly or masses felt. Normal bowel sounds heard. Mild left renal angle tenderness. Central nervous system: Alert and oriented. No focal neurological deficits. Extremities: Symmetric 5 x 5 power. Skin: No rashes, lesions or ulcers Psychiatry: Judgement and insight appear normal. Mood & affect appropriate.     Data Reviewed: I have personally reviewed following labs and imaging studies  CBC:  Recent Labs Lab 02/04/16 2041 02/05/16 0229  WBC 11.3* 9.7  HGB 10.9* 9.8*  HCT 31.9* 28.5*  MCV 86.2 87.7  PLT 291 240   Basic Metabolic Panel:  Recent Labs Lab 02/04/16 2041 02/05/16 0229  NA 139 140  K 3.1* 3.8  CL 108 112*  CO2 22 20*  GLUCOSE 123* 101*  BUN 9 5*  CREATININE 0.69 0.53  CALCIUM 8.8* 7.7*  MG  --  1.6*   GFR: Estimated Creatinine Clearance: 105.8 mL/min (by C-G formula based on Cr of  0.53). Liver Function Tests: No results for input(s): AST, ALT, ALKPHOS, BILITOT, PROT, ALBUMIN in the last 168 hours. No results for input(s): LIPASE, AMYLASE in the last 168 hours. No results for input(s): AMMONIA in the last 168 hours. Coagulation Profile:  Recent Labs Lab 02/05/16 0229  INR 1.24   Cardiac Enzymes: No results for input(s): CKTOTAL, CKMB, CKMBINDEX, TROPONINI in the last 168  hours. BNP (last 3 results) No results for input(s): PROBNP in the last 8760 hours. HbA1C: No results for input(s): HGBA1C in the last 72 hours. CBG: No results for input(s): GLUCAP in the last 168 hours. Lipid Profile: No results for input(s): CHOL, HDL, LDLCALC, TRIG, CHOLHDL, LDLDIRECT in the last 72 hours. Thyroid Function Tests: No results for input(s): TSH, T4TOTAL, FREET4, T3FREE, THYROIDAB in the last 72 hours. Anemia Panel: No results for input(s): VITAMINB12, FOLATE, FERRITIN, TIBC, IRON, RETICCTPCT in the last 72 hours. Urine analysis:    Component Value Date/Time   COLORURINE YELLOW 02/04/2016 2029   APPEARANCEUR CLOUDY* 02/04/2016 2029   LABSPEC 1.018 02/04/2016 2029   PHURINE 6.0 02/04/2016 2029   GLUCOSEU NEGATIVE 02/04/2016 2029   HGBUR LARGE* 02/04/2016 2029   BILIRUBINUR NEGATIVE 02/04/2016 2029   KETONESUR NEGATIVE 02/04/2016 2029   PROTEINUR 30* 02/04/2016 2029   UROBILINOGEN 1.0 02/04/2016 1810   NITRITE POSITIVE* 02/04/2016 2029   LEUKOCYTESUR MODERATE* 02/04/2016 2029        Radiology Studies: Dg Chest Port 1 View  02/05/2016  CLINICAL DATA:  Sepsis EXAM: PORTABLE CHEST 1 VIEW COMPARISON:  12/25/2008 FINDINGS: Shallow inspiration with linear atelectasis or infiltration in the lung bases. Normal heart size and pulmonary vascularity. No blunting of costophrenic angles. No pneumothorax. Mediastinal contours appear intact. IMPRESSION: Atelectasis or infiltration in both lung bases. Pneumonia is not excluded in the appropriate clinical setting. Electronically Signed   By: Burman NievesWilliam  Stevens M.D.   On: 02/05/2016 01:58        Scheduled Meds: . cefTRIAXone (ROCEPHIN) IVPB 1 gram/50 mL D5W  1 g Intravenous Q24H  . enoxaparin (LOVENOX) injection  40 mg Subcutaneous Q24H  . prenatal vitamin w/FE, FA  1 tablet Oral Q1200  . sodium chloride  500 mL Intravenous Once  . sodium chloride flush  3 mL Intravenous Q12H   Continuous Infusions: . sodium chloride 125  mL/hr at 02/05/16 1305        Time spent: 35 minutes.    [facility] Philade[facility]Phia[facility]ALGI,Draya Felker, MD [facility] Pager [phone]  If 7PM-7AM, please contact night-coverage [email or web address] Password [email or web address] 02/05/2016, 3:04 PM

## 2016-02-05 NOTE — Progress Notes (Signed)
Patient arrived to floor approx. 0230 am via stretcher from ED. Denies pain at this time.  Normal saline at 125 infusing . Tele box #[id number] placed.

## 2016-02-05 NOTE — H&P (Addendum)
History and Physical    Anna Perry XLK:[id number] DOB: 03/26/1991 DOA: 02/04/2016  Referring MD/NP/PA:   PCP: Default, Provider, MD   Outpatient Specialists:none  Patient coming from:  Home   Chief Complaint: Dysuria, fever, left flank pain  HPI: Anna Perry is a 25 y.o. female without significant medical history, who presents with dysuria, fever, left flank pain.  Patient reports that she has been having left flank pain in the past 3 days, which is constant, moderate, radiating to the lower back. She also has fever, chills, nausea, headache. Patient also endorses dysuria, urinary frequency and malodorous urine. One episode of NBNB emesis while at urgent care.Pt denies neck pain, neck stiffness, chest pain, shortness of breath, cough, SOB, diarrhea or rashes.   ED Course: pt was found to have hypotension with blood pressure 71/58, which improved to 98/59 after 3 L of normal saline bolus, positive urinalysis with moderate amount of leukocytes and positive nitrates, lactate 0.89, negative pregnancy test, WBC 11.3, temperature 101, tachycardia, tachypnea, potassium 3.1, creatinine normal. CXR showed atelectasis vs. infiltration in both lung bases. Patient is placed to telemetry bed for observation.  Review of Systems:   General: no fevers, chills, no changes in body weight, has poor appetite, has fatigue HEENT: no blurry vision, hearing changes or sore throat Pulm: no dyspnea, coughing, wheezing CV: no chest pain, no palpitations Abd: has nausea, vomiting, no abdominal pain, diarrhea, constipation GU: has dysuria, burning on urination, increased urinary frequency, no hematuria  Ext: no leg edema Neuro: no unilateral weakness, numbness, or tingling, no vision change or hearing loss Skin: no rash MSK: No muscle spasm, no deformity, no limitation of range of movement in spin Heme: No easy bruising.  Travel history: No recent long distant travel.  Allergy: No Known  Allergies  History reviewed. No pertinent past medical history.  Past Surgical History  Procedure Laterality Date  . Cesarean section      Social History:  reports that she has never smoked. She does not have any smokeless tobacco history on file. She reports that she does not drink alcohol. Her drug history is not on file.  Family History: Reviewed with patient, all family members are healthy.  Prior to Admission medications   Medication Sig Start Date End Date Taking? Authorizing Provider  naproxen sodium (ALEVE) 220 MG tablet Take 220 mg by mouth daily as needed. For pain   Yes Historical Provider, MD  prenatal vitamin w/FE, FA (PRENATAL 1 + 1) 27-1 MG TABS tablet Take 1 tablet by mouth daily at 12 noon.   Yes Historical Provider, MD  azithromycin (ZITHROMAX) 250 MG tablet Take 4 tablets (1,000 mg total) by mouth once. Take first 2 tablets together, then 1 every day until finished. Patient not taking: Reported on 02/04/2016 08/14/12   Warrick Parisian, MD  ondansetron (ZOFRAN) 4 MG tablet Take 1 tablet (4 mg total) by mouth every 8 (eight) hours as needed for nausea. Patient not taking: Reported on 02/04/2016 08/08/12   Warrick Parisian, MD  traMADol (ULTRAM) 50 MG tablet Take 2 tablets (100 mg total) by mouth every 8 (eight) hours as needed for pain. Patient not taking: Reported on 02/04/2016 08/07/12   Reuben Likes, MD    Physical Exam: Filed Vitals:   02/05/16 0000 02/05/16 0015 02/05/16 0030 02/05/16 0159  BP: 94/52  Pulse: 89 87 87 84  Temp:    97.8 F (36.6 C)  TempSrc:    Oral  Resp:  16  Weight:      SpO2: 98% 99% 98% 99%   General: Not in acute distress HEENT:       Eyes: PERRL, EOMI, no scleral icterus.       ENT: No discharge from the ears and nose, no pharynx injection, no tonsillar enlargement.        Neck: No JVD, no bruit, no mass felt. Heme: No neck lymph node enlargement. Cardiac: S1/S2, RRR, No murmurs, No gallops or rubs. Pulm: No rales,  wheezing, rhonchi or rubs. Abd: Soft, nondistended, nontender, no rebound pain, no organomegaly, BS present. GU: has left CVA tenderness. Ext: No pitting leg edema bilaterally. 2+DP/PT pulse bilaterally. Musculoskeletal: No joint deformities, No joint redness or warmth, no limitation of ROM in spin. Skin: No rashes.  Neuro: Alert, oriented X3, cranial nerves II-XII grossly intact, moves all extremities normally. Psych: Patient is not psychotic, no suicidal or hemocidal ideation.  Labs on Admission: I have personally reviewed following labs and imaging studies  CBC:  Recent Labs Lab 02/04/16 2041  WBC 11.3*  HGB 10.9*  HCT 31.9*  MCV 86.2  PLT 291   Basic Metabolic Panel:  Recent Labs Lab 02/04/16 2041  NA 139  K 3.1*  CL 108  CO2 22  GLUCOSE 123*  BUN 9  CREATININE 0.69  CALCIUM 8.8*   GFR: CrCl cannot be calculated (Unknown ideal weight.). Liver Function Tests: No results for input(s): AST, ALT, ALKPHOS, BILITOT, PROT, ALBUMIN in the last 168 hours. No results for input(s): LIPASE, AMYLASE in the last 168 hours. No results for input(s): AMMONIA in the last 168 hours. Coagulation Profile: No results for input(s): INR, PROTIME in the last 168 hours. Cardiac Enzymes: No results for input(s): CKTOTAL, CKMB, CKMBINDEX, TROPONINI in the last 168 hours. BNP (last 3 results) No results for input(s): PROBNP in the last 8760 hours. HbA1C: No results for input(s): HGBA1C in the last 72 hours. CBG: No results for input(s): GLUCAP in the last 168 hours. Lipid Profile: No results for input(s): CHOL, HDL, LDLCALC, TRIG, CHOLHDL, LDLDIRECT in the last 72 hours. Thyroid Function Tests: No results for input(s): TSH, T4TOTAL, FREET4, T3FREE, THYROIDAB in the last 72 hours. Anemia Panel: No results for input(s): VITAMINB12, FOLATE, FERRITIN, TIBC, IRON, RETICCTPCT in the last 72 hours. Urine analysis:    Component Value Date/Time   COLORURINE YELLOW 02/04/2016 2029    APPEARANCEUR CLOUDY* 02/04/2016 2029   LABSPEC 1.018 02/04/2016 2029   PHURINE 6.0 02/04/2016 2029   GLUCOSEU NEGATIVE 02/04/2016 2029   HGBUR LARGE* 02/04/2016 2029   BILIRUBINUR NEGATIVE 02/04/2016 2029   KETONESUR NEGATIVE 02/04/2016 2029   PROTEINUR 30* 02/04/2016 2029   UROBILINOGEN 1.0 02/04/2016 1810   NITRITE POSITIVE* 02/04/2016 2029   LEUKOCYTESUR MODERATE* 02/04/2016 2029   Sepsis Labs: @LABRCNTIP (procalcitonin:4,lacticidven:4) )No results found for this or any previous visit (from the past 240 hour(s)).   Radiological Exams on Admission: Dg Chest Port 1 View  02/05/2016  CLINICAL DATA:  Sepsis EXAM: PORTABLE CHEST 1 VIEW COMPARISON:  12/25/2008 FINDINGS: Shallow inspiration with linear atelectasis or infiltration in the lung bases. Normal heart size and pulmonary vascularity. No blunting of costophrenic angles. No pneumothorax. Mediastinal contours appear intact. IMPRESSION: Atelectasis or infiltration in both lung bases. Pneumonia is not excluded in the appropriate clinical setting. Electronically Signed   By: Burman Nieves M.D.   On: 02/05/2016 01:58     EKG:  Not done in ED, will get one.   Assessment/Plan Principal Problem:   Pyelonephritis  Active Problems:   Sepsis (HCC)   Hypokalemia   Pyelonephritis and sepsis: Patient's dysuria, fever, left flank pain and CVA tenderness, are consistent with pyelonephritis. Patient meets criteria for sepsis, with hypotension, leukocytosis, fever, tachycardia and tachypnea. Lactate is normal. Hypotension responded to IV fluid, current blood pressure is 98/59. Hemodynamically stable.   - will place on telemetry bed for obs - Ceftriaxone by IV - Follow up results of urine and blood cx and amend antibiotic regimen if needed per sensitivity results - prn Zofran for nausea - will get Procalcitonin and trend lactic acid levels per sepsis protocol. - IVF: 3.5L of NS bolus in ED, followed by 125 cc/h  Hypokalemia: K=3.1 on  admission. - Repleted - Check Mg level   DVT ppx:  SQ Lovenox Code Status: Full code Family Communication: None at bed side. Disposition Plan:  Anticipate discharge back to previous home environment Consults called: none Admission status: Obs / tele  Date of Service 02/05/2016    Lorretta HarpIU, Brayant Dorr [facility] Pager [phone]  If 7PM-7AM, please contact night-coverage [email or web address] Password [email or web address] 02/05/2016, 2:52 AM

## 2016-02-06 LAB — CBC
HCT: 30.7 % — ABNORMAL LOW (ref 36.0–46.0)
Hemoglobin: 10.5 g/dL — ABNORMAL LOW (ref 12.0–15.0)
MCH: 30 pg (ref 26.0–34.0)
MCHC: 34.2 g/dL (ref 30.0–36.0)
MCV: 87.7 fL (ref 78.0–100.0)
Platelets: 252 10*3/uL (ref 150–400)
RBC: 3.5 MIL/uL — ABNORMAL LOW (ref 3.87–5.11)
RDW: 12.6 % (ref 11.5–15.5)
WBC: 7.1 10*3/uL (ref 4.0–10.5)

## 2016-02-06 LAB — BASIC METABOLIC PANEL
Anion gap: 9 (ref 5–15)
CALCIUM: 8.2 mg/dL — AB (ref 8.9–10.3)
CO2: 22 mmol/L (ref 22–32)
Chloride: 105 mmol/L (ref 101–111)
Creatinine, Ser: 0.51 mg/dL (ref 0.44–1.00)
GFR calc non Af Amer: >60 mL/min (ref >60)
Glucose, Bld: 101 mg/dL — ABNORMAL HIGH (ref 65–99)
Potassium: 3.3 mmol/L — ABNORMAL LOW (ref 3.5–5.1)
SODIUM: 136 mmol/L (ref 135–145)

## 2016-02-06 LAB — MAGNESIUM: MAGNESIUM: 1.9 mg/dL (ref 1.7–2.4)

## 2016-02-06 MED ORDER — SODIUM CHLORIDE 0.9 % IV SOLN
INTRAVENOUS | Status: AC
  Administered 2016-02-06 – 2016-02-07 (×2): via INTRAVENOUS

## 2016-02-06 MED ORDER — POTASSIUM CHLORIDE CRYS ER 20 MEQ PO TBCR
40.0000 meq | EXTENDED_RELEASE_TABLET | Freq: Once | ORAL | Status: AC
  Administered 2016-02-06: 40 meq via ORAL
  Filled 2016-02-06: qty 2

## 2016-02-06 NOTE — Progress Notes (Signed)
PROGRESS NOTE  Anna Perry  ZOX:[id number] DOB: 08-28-1991  DOA: 02/04/2016 PCP: Default, Provider, MD  Outpatient Specialists:  None known.  Brief Narrative:  25 year old Spanish-speaking female, no significant PMH, presented to [facility] ED on 02/04/16 with complaints of dysuria, fever and left flank pain. Hypotensive in the ED 71/58, lactate normal, negative urine pregnancy test, WBC 11.3, febrile 101, tachycardic, tachypneic, potassium 3.1, chest x-ray suggested atelectasis versus infiltration and lung bases. Admitted for suspected acute pyelonephritis. Improving.   Assessment & Plan:   Principal Problem:   Pyelonephritis Active Problems:   Sepsis (HCC)   Hypokalemia   Sepsis secondary to acute pyelonephritis - Sepsis features present on admission. - Continue IV fluids and IV Rocephin pending urine and blood culture results. - Improved. - Although chest x-ray suggests atelectasis or infiltration in lung bases, no respiratory symptoms to suggest pneumonia. Probably atelectasis. Incentive spirometry. Follow chest x-ray in a.m.  Hypokalemia - Replace  Hypomagnesemia - Replaced.  Anemia - Possibly chronic. Stable.   DVT prophylaxis: Lovenox Code Status: Full Family Communication: None at bedside Disposition Plan: DC home, possibly in 24-48 hours.   Consultants:   None  Procedures:   None  Antimicrobials:   IV Rocephin 5/6 >    Subjective: Interviewed via telephone interpreter. Left-sided abdominal pain has almost resolved. Slightly dizzy at times on ambulating.  Objective:  Filed Vitals:   02/05/16 1601 02/05/16 2202 02/06/16 0524 02/06/16 1353  BP: 107/50 105/56 96/59 103/58  Pulse: 89 90 79 79  Temp: 97.8 F (36.6 C) 99.1 F (37.3 C) 97.6 F (36.4 C) 97.6 F (36.4 C)  TempSrc:      Resp: Height:      Weight:      SpO2: 97% 99% 100% 100%    Intake/Output Summary (Last 24 hours) at 02/06/16 1717 Last data filed at 02/06/16  8119  Gross per 24 hour  Intake     50 ml  Output      0 ml  Net     50 ml   Filed Weights   02/04/16 2021 02/05/16 0159  Weight: 81.647 kg (180 lb) 85.049 kg (187 lb 8 oz)    Examination:  General exam: Pleasant young female lying comfortably propped up in bed. Does not appear septic or toxic currently. Appears calm and comfortable  Respiratory system: Clear to auscultation. Respiratory effort normal. Cardiovascular system: S1 & S2 heard, RRR.Marland Kitchen No JVD, murmurs, rubs, gallops or clicks. No pedal edema. Telemetry: Sinus rhythm. Gastrointestinal system: Abdomen is nondistended, soft and nontender. No organomegaly or masses felt. Normal bowel sounds heard. Mild left renal angle tenderness. Central nervous system: Alert and oriented. No focal neurological deficits. Extremities: Symmetric 5 x 5 power. Skin: No rashes, lesions or ulcers Psychiatry: Judgement and insight appear normal. Mood & affect appropriate.     Data Reviewed: I have personally reviewed following labs and imaging studies  CBC:  Recent Labs Lab 02/04/16 2041 02/05/16 0229 02/06/16 0553  WBC 11.3* 9.7 7.1  HGB 10.9* 9.8* 10.5*  HCT 31.9* 28.5* 30.7*  MCV 86.2 87.7 87.7  PLT 291 240 252   Basic Metabolic Panel:  Recent Labs Lab 02/04/16 2041 02/05/16 0229 02/06/16 0553  NA 139 140 136  K 3.1* 3.8 3.3*  CL 108 112* 105  CO2 22 20* 22  GLUCOSE 123* 101* 101*  BUN 9 5* <5*  CREATININE 0.69 0.53 0.51  CALCIUM 8.8* 7.7* 8.2*  MG  --  1.6* 1.9  GFR: Estimated Creatinine Clearance: 105.8 mL/min (by C-G formula based on Cr of 0.51). Liver Function Tests: No results for input(s): AST, ALT, ALKPHOS, BILITOT, PROT, ALBUMIN in the last 168 hours. No results for input(s): LIPASE, AMYLASE in the last 168 hours. No results for input(s): AMMONIA in the last 168 hours. Coagulation Profile:  Recent Labs Lab 02/05/16 0229  INR 1.24   Cardiac Enzymes: No results for input(s): CKTOTAL, CKMB, CKMBINDEX,  TROPONINI in the last 168 hours. BNP (last 3 results) No results for input(s): PROBNP in the last 8760 hours. HbA1C: No results for input(s): HGBA1C in the last 72 hours. CBG: No results for input(s): GLUCAP in the last 168 hours. Lipid Profile: No results for input(s): CHOL, HDL, LDLCALC, TRIG, CHOLHDL, LDLDIRECT in the last 72 hours. Thyroid Function Tests: No results for input(s): TSH, T4TOTAL, FREET4, T3FREE, THYROIDAB in the last 72 hours. Anemia Panel: No results for input(s): VITAMINB12, FOLATE, FERRITIN, TIBC, IRON, RETICCTPCT in the last 72 hours. Urine analysis:    Component Value Date/Time   COLORURINE YELLOW 02/04/2016 2029   APPEARANCEUR CLOUDY* 02/04/2016 2029   LABSPEC 1.018 02/04/2016 2029   PHURINE 6.0 02/04/2016 2029   GLUCOSEU NEGATIVE 02/04/2016 2029   HGBUR LARGE* 02/04/2016 2029   BILIRUBINUR NEGATIVE 02/04/2016 2029   KETONESUR NEGATIVE 02/04/2016 2029   PROTEINUR 30* 02/04/2016 2029   UROBILINOGEN 1.0 02/04/2016 1810   NITRITE POSITIVE* 02/04/2016 2029   LEUKOCYTESUR MODERATE* 02/04/2016 2029        Radiology Studies: Dg Chest Port 1 View  02/05/2016  CLINICAL DATA:  Sepsis EXAM: PORTABLE CHEST 1 VIEW COMPARISON:  12/25/2008 FINDINGS: Shallow inspiration with linear atelectasis or infiltration in the lung bases. Normal heart size and pulmonary vascularity. No blunting of costophrenic angles. No pneumothorax. Mediastinal contours appear intact. IMPRESSION: Atelectasis or infiltration in both lung bases. Pneumonia is not excluded in the appropriate clinical setting. Electronically Signed   By: Burman NievesWilliam  Stevens M.D.   On: 02/05/2016 01:58        Scheduled Meds: . cefTRIAXone (ROCEPHIN) IVPB 1 gram/50 mL D5W  1 g Intravenous Q24H  . enoxaparin (LOVENOX) injection  40 mg Subcutaneous Q24H  . prenatal vitamin w/FE, FA  1 tablet Oral Q1200  . sodium chloride  500 mL Intravenous Once  . sodium chloride flush  3 mL Intravenous Q12H   Continuous  Infusions:     LOS: 1 day    Time spent: 25 minutes.    Va Medical Center - Montrose CampusNGALGI,Sian Rockers, MD [facility] Pager [phone]  If 7PM-7AM, please contact night-coverage [email or web address] Password [email or web address] 02/06/2016, 5:17 PM

## 2016-02-07 ENCOUNTER — Inpatient Hospital Stay (HOSPITAL_COMMUNITY): Payer: MEDICAID

## 2016-02-07 DIAGNOSIS — A4151 Sepsis due to Escherichia coli [E. coli]: Secondary | ICD-10-CM

## 2016-02-07 LAB — BASIC METABOLIC PANEL
Anion gap: 12 (ref 5–15)
BUN: <5 mg/dL — ABNORMAL LOW (ref 6–20)
CHLORIDE: 102 mmol/L (ref 101–111)
CO2: 23 mmol/L (ref 22–32)
Calcium: 8.7 mg/dL — ABNORMAL LOW (ref 8.9–10.3)
Creatinine, Ser: 0.49 mg/dL (ref 0.44–1.00)
GFR calc non Af Amer: >60 mL/min (ref >60)
Glucose, Bld: 89 mg/dL (ref 65–99)
POTASSIUM: 3.9 mmol/L (ref 3.5–5.1)
SODIUM: 137 mmol/L (ref 135–145)

## 2016-02-07 LAB — URINE CULTURE
Culture: >=100000 — AB
Special Requests: NORMAL

## 2016-02-07 MED ORDER — AMOXICILLIN 500 MG PO CAPS
500.0000 mg | ORAL_CAPSULE | Freq: Three times a day (TID) | ORAL | Status: DC
  Administered 2016-02-07: 500 mg via ORAL
  Filled 2016-02-07 (×3): qty 1

## 2016-02-07 MED ORDER — AMOXICILLIN 500 MG PO CAPS: 500.0000 mg | ORAL_CAPSULE | Freq: Three times a day (TID) | ORAL | Status: DC

## 2016-02-07 MED FILL — AMOXICILLIN 500 MG CAPSULE: 500 | 11 days supply | Qty: 11 | Fill #0

## 2016-02-07 NOTE — Care Management Note (Addendum)
Case Management Note  Patient Details  Name: Anna Perry MRN: [id number] Date of Birth: 03/03/1991  Subjective/Objective:          Non english, spanish speaking female admitted with pyelonephritis. Resides with husband and 2 children. Independent with ADL's PTA. No DME usage. Pt without insurance and no PCP. CM [facility] to help establish [facility] and address medication needs.    Action/Plan: Discharge to home today with family.  Expected Discharge Date:  02/07/16               Expected Discharge Plan:  Home/Self Care  In-House Referral:     Discharge planning Services  CM Consult  Post Acute Care Choice:    Choice offered to:     DME Arranged:    DME Agency:     HH Arranged:    HH Agency:     Status of Service:  In process, will continue to follow  Medicare Important Message Given:    Date Medicare IM Given:    Medicare IM give by:    Date Additional Medicare IM Given:    Additional Medicare Important Message give by:     If discussed at Long Length of Stay Meetings, dates discussed:    Additional Comments:  02/07/2016 1400 CM scheduled [facility] up appointment [facility] on 02/20/2016 at 3:00pm. Pt is scheduled to see Concepcion LivingLinda Bernhardt NP. CM made pt aware , appointment noted on noted on AVS.   CM spoke with pt though Spanish interpreter ([phone]) via phone and explain discharge planning. CM shared with pt plan is [facility] on [facility].     Gae GallopCole, Chani Ghanem WashamHudson, ArizonaRN,BSN,CM [phone] 02/07/2016, 12:42 PM

## 2016-02-07 NOTE — Discharge Summary (Signed)
Physician Discharge Summary  Anna Perry  ZOX:[id number]  DOB: 16-Apr-1991  DOA: 02/04/2016  PCP: Default, Provider, MD  Admit date: 02/04/2016 Discharge date: 02/07/2016  Time spent: Greater than 30 minutes  Recommendations for Outpatient Follow-up:  1. [facility]/PCP on 02/20/16 at 3 PM. To be seen with repeat labs (CBC & BMP). Please follow final blood culture results that were sent from the hospital. 2. Recommend repeating chest x-ray in 3-4 weeks.  Discharge Diagnoses:  Principal Problem:   Pyelonephritis Active Problems:   Sepsis (HCC)   Hypokalemia   Discharge Condition: Improved & Stable  Diet recommendation: Regular diet.  Filed Weights   02/04/16 2021 02/05/16 0159  Weight: 81.647 kg (180 lb) 85.049 kg (187 lb 8 oz)    History of present illness:  25 year old Spanish-speaking female, no significant PMH, presented to [facility] ED on 02/04/16 with complaints of dysuria, fever and left flank pain. Hypotensive in the ED 71/58, lactate normal, negative urine pregnancy test, WBC 11.3, febrile 101, tachycardic, tachypneic, potassium 3.1, chest x-ray suggested atelectasis versus infiltration and lung bases. Admitted for suspected acute pyelonephritis.  Hospital Course:   Sepsis secondary to acute pyelonephritis - Sepsis features present on admission. - Treated with IV fluids and Rocephin-completed 3 days course. Urine culture shows Escherichia coli which is pansensitive. Transitioned to oral amoxicillin to complete total 7 days treatment. - Although chest x-ray suggests atelectasis or infiltration in lung bases, no respiratory symptoms to suggest pneumonia. Probably atelectasis. Incentive spirometry. Repeat chest x-ray: Improved left basilar aeration-likely atelectasis.  Hypokalemia - Replaced  Hypomagnesemia - Replaced.  Anemia - Possibly chronic. Stable.   Consultants:   None  Procedures:   None  Discharge Exam:  Complaints:  Patient  was interviewed via phone interpreter. Denies complaints. No pain or dizziness reported. Denies cough or dyspnea.  Filed Vitals:   02/06/16 0524 02/06/16 1353 02/06/16 2158 02/07/16 0517  BP: 96/59 103/58 103/67 100/52  Pulse: 79 79 74 76  Temp: 97.6 F (36.4 C) 97.6 F (36.4 C) 99.8 F (37.7 C) 98.9 F (37.2 C)  TempSrc:   Oral   Resp: 16 20 16 20   Height:      Weight:      SpO2: 100% 100% 97% 98%    General exam: Pleasant young female sitting up comfortably in bed. Respiratory system: Clear. No increased work of breathing. Cardiovascular system: S1 & S2 heard, RRR. No JVD, murmurs, gallops, clicks or pedal edema. Gastrointestinal system: Abdomen is nondistended, soft and nontender. Normal bowel sounds heard. Central nervous system: Alert and oriented. No focal neurological deficits. Extremities: Symmetric 5 x 5 power.  Discharge Instructions      Discharge Instructions    Activity as tolerated - No restrictions    Complete by:  As directed      Call MD for:  extreme fatigue    Complete by:  As directed      Call MD for:  persistant dizziness or light-headedness    Complete by:  As directed      Call MD for:  persistant nausea and vomiting    Complete by:  As directed      Call MD for:  severe uncontrolled pain    Complete by:  As directed      Call MD for:  temperature >100.4    Complete by:  As directed      Diet general    Complete by:  As directed  Medication List    STOP taking these medications        azithromycin 250 MG tablet  Commonly known as:  ZITHROMAX     ondansetron 4 MG tablet  Commonly known as:  ZOFRAN     traMADol 50 MG tablet  Commonly known as:  ULTRAM      TAKE these medications        ALEVE 220 MG tablet  Generic drug:  naproxen sodium  Take 220 mg by mouth daily as needed. For pain     amoxicillin 500 MG capsule  Commonly known as:  AMOXIL  Take 1 capsule (500 mg total) by mouth 3 (three) times daily.      prenatal vitamin w/FE, FA 27-1 MG Tabs tablet  Take 1 tablet by mouth daily at 12 noon.       Follow-up Information    Follow up with [facility] On 02/20/2016.   Specialty:  Internal Medicine   Why:  Post hospital follow up scheduled for 02/20/2016 at 3pm. To be seen with repeat labs (CBC & BMP).   Contact information:   [address] [place] [address] [phone]      Get Medicines reviewed and adjusted: Please take all your medications with you for your next visit with your Primary MD  Please request your Primary MD to go over all hospital tests and procedure/radiological results at the follow up. Please ask your Primary MD to get all Hospital records sent to his/her office.  If you experience worsening of your admission symptoms, develop shortness of breath, life threatening emergency, suicidal or homicidal thoughts you must seek medical attention immediately by calling 911 or calling your MD immediately if symptoms less severe.  You must read complete instructions/literature along with all the possible adverse reactions/side effects for all the Medicines you take and that have been prescribed to you. Take any new Medicines after you have completely understood and accept all the possible adverse reactions/side effects.   Do not drive when taking pain medications.   Do not take more than prescribed Pain, Sleep and Anxiety Medications  Special Instructions: If you have smoked or chewed Tobacco in the last 2 yrs please stop smoking, stop any regular Alcohol and or any Recreational drug use.  Wear Seat belts while driving.  Please note  You were cared for by a hospitalist during your hospital stay. Once you are discharged, your primary care physician will handle any further medical issues. Please note that NO REFILLS for any discharge medications will be authorized once you are discharged, as it is imperative that you return to your primary  care physician (or establish a relationship with a primary care physician if you do not have one) for your aftercare needs so that they can reassess your need for medications and monitor your lab values.    The results of significant diagnostics from this hospitalization (including imaging, microbiology, ancillary and laboratory) are listed below for reference.    Significant Diagnostic Studies: Dg Chest 2 View  02/07/2016  CLINICAL DATA:  Followup abnormal chest x-ray. EXAM: CHEST  2 VIEW COMPARISON:  02/05/2016 FINDINGS: Improved left basilar aeration. There is no edema, consolidation, effusion, or pneumothorax. Normal heart size and mediastinal contours. IMPRESSION: Improved left basilar aeration. The prior opacity was likely atelectasis. Electronically Signed   By: Marnee Spring M.D.   On: 02/07/2016 11:12   Dg Chest Port 1 View  02/05/2016  CLINICAL DATA:  Sepsis  EXAM: PORTABLE CHEST 1 VIEW COMPARISON:  12/25/2008 FINDINGS: Shallow inspiration with linear atelectasis or infiltration in the lung bases. Normal heart size and pulmonary vascularity. No blunting of costophrenic angles. No pneumothorax. Mediastinal contours appear intact. IMPRESSION: Atelectasis or infiltration in both lung bases. Pneumonia is not excluded in the appropriate clinical setting. Electronically Signed   By: Burman Nieves M.D.   On: 02/05/2016 01:58    Microbiology: Recent Results (from the past 240 hour(s))  Urine C&S     Status: Abnormal   Collection Time: 02/04/16  8:29 PM  Result Value Ref Range Status   Specimen Description URINE, CLEAN CATCH  Final   Special Requests Normal  Final   Culture >=100,000 COLONIES/mL ESCHERICHIA COLI (A)  Final   Report Status 02/07/2016 FINAL  Final   Organism ID, Bacteria ESCHERICHIA COLI (A)  Final      Susceptibility   Escherichia coli - MIC*    AMPICILLIN <=2 SENSITIVE Sensitive     CEFAZOLIN <=4 SENSITIVE Sensitive     CEFTRIAXONE <=1 SENSITIVE Sensitive      CIPROFLOXACIN <=0.25 SENSITIVE Sensitive     GENTAMICIN <=1 SENSITIVE Sensitive     IMIPENEM <=0.25 SENSITIVE Sensitive     NITROFURANTOIN <=16 SENSITIVE Sensitive     TRIMETH/SULFA <=20 SENSITIVE Sensitive     AMPICILLIN/SULBACTAM <=2 SENSITIVE Sensitive     PIP/TAZO <=4 SENSITIVE Sensitive     * >=100,000 COLONIES/mL ESCHERICHIA COLI  Culture, blood (x 2)     Status: None (Preliminary result)   Collection Time: 02/05/16  2:38 AM  Result Value Ref Range Status   Specimen Description BLOOD LEFT ANTECUBITAL  Final   Special Requests BOTTLES DRAWN AEROBIC AND ANAEROBIC 5CC  Final   Culture NO GROWTH 1 DAY  Final   Report Status PENDING  Incomplete  Culture, blood (x 2)     Status: None (Preliminary result)   Collection Time: 02/05/16  2:47 AM  Result Value Ref Range Status   Specimen Description BLOOD RIGHT ANTECUBITAL  Final   Special Requests BOTTLES DRAWN AEROBIC AND ANAEROBIC 5CC  Final   Culture NO GROWTH 1 DAY  Final   Report Status PENDING  Incomplete     Labs: Basic Metabolic Panel:  Recent Labs Lab 02/04/16 2041 02/05/16 0229 02/06/16 0553 02/07/16 0654  NA 139 140 136 137  K 3.1* 3.8 3.3* 3.9  CL 108 112* 105 102  CO2 22 20* 22 23  GLUCOSE 123* 101* 101* 89  BUN 9 5* <5* <5*  CREATININE 0.69 0.53 0.51 0.49  CALCIUM 8.8* 7.7* 8.2* 8.7*  MG  --  1.6* 1.9  --    Liver Function Tests: No results for input(s): AST, ALT, ALKPHOS, BILITOT, PROT, ALBUMIN in the last 168 hours. No results for input(s): LIPASE, AMYLASE in the last 168 hours. No results for input(s): AMMONIA in the last 168 hours. CBC:  Recent Labs Lab 02/04/16 2041 02/05/16 0229 02/06/16 0553  WBC 11.3* 9.7 7.1  HGB 10.9* 9.8* 10.5*  HCT 31.9* 28.5* 30.7*  MCV 86.2 87.7 87.7  PLT 291 240 252   Cardiac Enzymes: No results for input(s): CKTOTAL, CKMB, CKMBINDEX, TROPONINI in the last 168 hours. BNP: BNP (last 3 results) No results for input(s): BNP in the last 8760 hours.  ProBNP (last 3  results) No results for input(s): PROBNP in the last 8760 hours.  CBG: No results for input(s): GLUCAP in the last 168 hours.     Signed:  Marcellus Scott, MD, FACP, FHM.  [facility] Pager [phone]  If 7PM-7AM, please contact night-coverage [email or web address] Password [email or web address] 02/07/2016, 2:12 PM

## 2016-02-07 NOTE — Progress Notes (Signed)
Nsg Discharge Note  Admit Date:  02/04/2016 Discharge date: 02/07/2016   Anna Perry to be D/C'd Home per MD order.  AVS completed.  Copy for chart, and copy for patient signed, and dated. Patient/caregiver able to verbalize understanding.  Discharge Medication:   Medication List    STOP taking these medications        azithromycin 250 MG tablet  Commonly known as:  ZITHROMAX     ondansetron 4 MG tablet  Commonly known as:  ZOFRAN     traMADol 50 MG tablet  Commonly known as:  ULTRAM      TAKE these medications        ALEVE 220 MG tablet  Generic drug:  naproxen sodium  Take 220 mg by mouth daily as needed. For pain     amoxicillin 500 MG capsule  Commonly known as:  AMOXIL  Take 1 capsule (500 mg total) by mouth 3 (three) times daily.     prenatal vitamin w/FE, FA 27-1 MG Tabs tablet  Take 1 tablet by mouth daily at 12 noon.        Discharge Assessment: Filed Vitals:   02/07/16 0517 02/07/16 1504  BP: 100/52 98/64  Pulse: 76 55  Temp: 98.9 F (37.2 C) 98.2 F (36.8 C)  Resp: 20 18   Skin clean, dry and intact without evidence of skin break down, no evidence of skin tears noted. IV catheter discontinued intact. Site without signs and symptoms of complications - no redness or edema noted at insertion site, patient denies c/o pain - only slight tenderness at site.  Dressing with slight pressure applied.  D/c Instructions-Education: Discharge instructions given to patient/family with verbalized understanding. Interpreter Mardene CelesteJoanna assisted with discharge instructions in Spanish. D/c education completed with patient/family including follow up instructions, medication list, d/c activities limitations if indicated, with other d/c instructions as indicated by MD - patient able to verbalize understanding, all questions fully answered. Patient instructed to return to ED, call 911, or call MD for any changes in condition.  Patient escorted via WC, and D/C home via  private auto.  Lasean Gorniak Consuella Loselaine, RN 02/07/2016 6:13 PM

## 2016-02-07 NOTE — Discharge Instructions (Addendum)
Pielonefritis en los adultos °(Pyelonephritis, Adult) °La pielonefritis es una infección del riñón. Los riñones son los órganos que filtran la sangre y eliminan los residuos del torrente sanguíneo a través de la orina. La orina pasa desde los riñones, a través de los uréteres, hacia la vejiga. Hay dos tipos principales de pielonefritis: °· Infecciones que se inician rápidamente sin síntomas previos (pielonefritis aguda). °· Infecciones que persisten durante un período prolongado (pielonefritis crónica). °En la mayoría de los casos, la infección desaparece con el tratamiento y no causa otros problemas. Las infecciones más graves o crónicas a veces pueden propagarse al torrente sanguíneo u ocasionar otros problemas en los riñones. °CAUSAS °Por lo general, entre las causas de esta afección, se incluyen las siguientes: °· Bacterias que pasan desde la vejiga al riñón a través de la orina infectada. La orina de la vejiga puede infectarse por bacterias relacionadas con estas causas: °¨ Infección en la vejiga (cistitis). °¨ Inflamación de la próstata (prostatitis). °¨ Relaciones sexuales en las mujeres. °· Bacterias que pasan del torrente sanguíneo al riñón. °FACTORES DE RIESGO °Es más probable que esta afección se manifieste en: °· Las embarazadas. °· Las personas de edad avanzada. °· Los diabéticos. °· Las personas que tienen cálculos en los riñones o la vejiga. °· Las personas que tienen otras anomalías en el riñón o los uréteres. °· Las personas que tienen una sonda vesical. °· Las personas con cáncer. °· Las personas que son sexualmente activas. °· Las mujeres que usan espermicidas. °· Las personas que han tenido una infección previa en las vías urinarias. °SÍNTOMAS °Los síntomas de esta afección incluyen lo siguiente: °· Ganas frecuentes de orinar. °· Necesidad intensa o persistente de orinar. °· Sensación de ardor o escozor al orinar. °· Dolor abdominal. °· Dolor de espalda. °· Dolor al costado del cuerpo o en la  fosa lumbar. °· Fiebre. °· Escalofríos. °· Sangre en la orina u orina oscura. °· Náuseas. °· Vómitos. °DIAGNÓSTICO °Esta afección se puede diagnosticar en función de lo siguiente: °· Examen físico e historia clínica. °· Análisis de orina. °· Análisis de sangre. °También pueden hacerle estudios de diagnóstico por imágenes de los riñones, por ejemplo, una ecografía o una tomografía computarizada. °TRATAMIENTO °El tratamiento de esta afección puede depender de la gravedad de la infección. °· Si la infección es leve y se detecta rápidamente, pueden administrarle antibióticos por vía oral. Deberá tomar líquido para permanecer hidratado. °· Si la infección es más grave, es posible que deban hospitalizarlo para administrarle antibióticos directamente en una vena a través de una vía intravenosa (IV). Quizás también deban administrarle líquidos a través de una vía intravenosa si no se encuentra bien hidratado. Después de la hospitalización, es posible que deba tomar antibióticos durante un tiempo. °Podrán prescribirle otros tratamientos según la causa de la infección. °INSTRUCCIONES PARA EL CUIDADO EN EL HOGAR °Medicamentos °· Tome los medicamentos de venta libre y los recetados solamente como se lo haya indicado el médico. °· Si le recetaron un antibiótico, tómelo como se lo haya indicado el médico. No deje de tomar los antibióticos aunque comience a sentirse mejor. °Instrucciones generales °· Beba suficiente líquido para mantener la orina clara o de color amarillo pálido. °· Evite la cafeína, el té y las bebidas gaseosas. Estas sustancias irritan la vejiga. °· Orine con frecuencia. Evite retener la orina durante largos períodos. °· Orine antes y después de las relaciones sexuales. °· Después de defecar, las mujeres deben higienizarse la región perineal desde adelante hacia atrás. Use cada trozo de   papel higiénico solo una vez. °· Concurra a todas las visitas de control como se lo haya indicado el médico. Esto es  importante. °SOLICITE ATENCIÓN MÉDICA SI: °· Los síntomas no mejoran después de 2 días de tratamiento. °· Los síntomas empeoran. °· Tiene fiebre. °SOLICITE ATENCIÓN MÉDICA DE INMEDIATO SI: °· No puede tomar los antibióticos ni ingerir líquidos. °· Comienza a sentir escalofríos. °· Vomita. °· Siente un dolor intenso en la espalda o en la fosa lumbar. °· Se desmaya o siente una debilidad extrema. °  °Esta información no tiene como fin reemplazar el consejo del médico. Asegúrese de hacerle al médico cualquier pregunta que tenga. °  °Document Released: 06/27/2005 Document Revised: 06/08/2015 °Elsevier Interactive Patient Education ©2016 Elsevier Inc. ° °

## 2016-02-10 LAB — CULTURE, BLOOD (ROUTINE X 2)
CULTURE: NO GROWTH
CULTURE: NO GROWTH

## 2016-02-20 ENCOUNTER — Encounter: Payer: Self-pay | Admitting: Family Medicine

## 2016-02-20 ENCOUNTER — Ambulatory Visit (INDEPENDENT_AMBULATORY_CARE_PROVIDER_SITE_OTHER): Payer: Self-pay | Admitting: Family Medicine

## 2016-02-20 VITALS — BP 94/65 | HR 77 | Temp 98.2°F | Ht 61.0 in | Wt 181.0 lb

## 2016-02-20 DIAGNOSIS — Z23 Encounter for immunization: Secondary | ICD-10-CM

## 2016-02-20 DIAGNOSIS — A419 Sepsis, unspecified organism: Secondary | ICD-10-CM

## 2016-02-20 DIAGNOSIS — Z0000 Encounter for general adult medical examination without abnormal findings: Secondary | ICD-10-CM

## 2016-02-20 LAB — CBC WITH DIFFERENTIAL/PLATELET
BASOS PCT: 1 %
Basophils Absolute: 89 cells/uL (ref 0–200)
EOS ABS: 178 {cells}/uL (ref 15–500)
Eosinophils Relative: 2 %
HEMATOCRIT: 36.1 % (ref 35.0–45.0)
HEMOGLOBIN: 12.4 g/dL (ref 11.7–15.5)
LYMPHS ABS: 3026 {cells}/uL (ref 850–3900)
Lymphocytes Relative: 34 %
MCH: 30.5 pg (ref 27.0–33.0)
MCHC: 34.3 g/dL (ref 32.0–36.0)
MCV: 88.9 fL (ref 80.0–100.0)
MONO ABS: 623 {cells}/uL (ref 200–950)
MPV: 8.8 fL (ref 7.5–12.5)
Monocytes Relative: 7 %
NEUTROS ABS: 4984 {cells}/uL (ref 1500–7800)
Neutrophils Relative %: 56 %
PLATELETS: 431 10*3/uL — AB (ref 140–400)
RBC: 4.06 MIL/uL (ref 3.80–5.10)
RDW: 13.7 % (ref 11.0–15.0)
WBC: 8.9 10*3/uL (ref 3.8–10.8)

## 2016-02-20 LAB — COMPLETE METABOLIC PANEL WITH GFR
ALT: 42 U/L — ABNORMAL HIGH (ref 6–29)
AST: 31 U/L — AB (ref 10–30)
Albumin: 4.6 g/dL (ref 3.6–5.1)
Alkaline Phosphatase: 84 U/L (ref 33–115)
BUN: 10 mg/dL (ref 7–25)
CALCIUM: 9.1 mg/dL (ref 8.6–10.2)
CHLORIDE: 103 mmol/L (ref 98–110)
CO2: 23 mmol/L (ref 20–31)
Creat: 0.53 mg/dL (ref 0.50–1.10)
GFR, Est Non African American: >89 mL/min (ref >=60)
Glucose, Bld: 91 mg/dL (ref 65–99)
POTASSIUM: 3.7 mmol/L (ref 3.5–5.3)
Sodium: 138 mmol/L (ref 135–146)
Total Bilirubin: 0.5 mg/dL (ref 0.2–1.2)
Total Protein: 7.6 g/dL (ref 6.1–8.1)

## 2016-02-20 NOTE — Progress Notes (Signed)
Patient ID: Anna Perry, female   DOB: May 31, 1991, 25 y.o.   MRN: [id number]   Anna Perry, is a 25 y.o. female  [id number]  [id number]  DOB - May 31, 1991  CC:  Chief Complaint  Patient presents with  . new patient/get established    recent        HPI: Anna Perry is a 25 y.o. female here to establish care. She was seen in ED and admitted on 5/6 with pylo nephritis, hypotension and sepsis. She was treated and release after several days. [facility] Prior to this she had been in [facility].   She is on no chronic meds.  She reports no residual symptoms. She is in need of Tdap, HIV, PAP smear. I reviewed and found her blood cultures do in hospital negative. No Known Allergies History reviewed. No pertinent past medical history. Current Outpatient Prescriptions on File Prior to Visit  Medication Sig Dispense Refill  . prenatal vitamin w/FE, FA (PRENATAL 1 + 1) 27-1 MG TABS tablet Take 1 tablet by mouth daily at 12 noon.    . naproxen sodium (ALEVE) 220 MG tablet Take 220 mg by mouth daily as needed. Reported on 02/20/2016     No current facility-administered medications on file prior to visit.   History reviewed. No pertinent family history. Social History   Social History  . Marital Status: Married    Spouse Name: N/A  . Number of Children: N/A  . Years of Education: N/A   Occupational History  . Not on file.   Social History Main Topics  . Smoking status: Never Smoker   . Smokeless tobacco: Not on file  . Alcohol Use: No  . Drug Use: No  . Sexual Activity: Yes    Birth Control/ Protection: None   Other Topics Concern  . Not on file   Social History Narrative    Review of Systems: Constitutional: Negative for fever, chills, appetite change, weight loss,  Fatigue. Skin: Negative for rashes or lesions of concern. HENT: Negative for ear pain, ear discharge.nose bleeds Eyes: Negative for pain,  discharge, redness, itching and visual disturbance. Neck: Negative for pain, stiffness Respiratory: Negative for cough, shortness of breath,   Cardiovascular: Negative for chest pain, palpitations and leg swelling. Gastrointestinal:  nausea, vomiting, diarrhea, constipations. Positive for vague intermitend left lower adb pain.She is due to start her period. Genitourinary: Negative for dysuria, urgency, frequency, hematuria,  Musculoskeletal: Negative for back pain, joint pain, joint  swelling, and gait problem.Negative for weakness. Neurological: Negative for dizziness, tremors, seizures, syncope,   light-headedness, numbness. Positive for occassional headaches  Hematological: Negative for easy bruising or bleeding Psychiatric/Behavioral: Negative for depression, anxiety, decreased concentration, confusion   Objective:   Filed Vitals:   02/20/16 1512  BP: 94/65  Pulse: 77  Temp: 98.2 F (36.8 C)    Physical Exam: Constitutional: Patient appears well-developed and well-nourished. No distress. HENT: Normocephalic, atraumatic, External right and left ear normal. Oropharynx is clear and moist.  Eyes: Conjunctivae and EOM are normal. PERRLA, no scleral icterus. Neck: Normal ROM. Neck supple. No lymphadenopathy, No thyromegaly. CVS: RRR, S1/S2 +, no murmurs, no gallops, no rubs Pulmonary: Effort and breath sounds normal, no stridor, rhonchi, wheezes, rales.  Abdominal: Soft. Normoactive BS,, no distension, tenderness, rebound or guarding.  Musculoskeletal: Normal range of motion. No edema and no tenderness.  Neuro: Alert.Normal muscle tone coordination. Non-focal Skin: Skin is warm and dry. No rash noted. Not diaphoretic. No  erythema. No pallor. Psychiatric: Normal mood and affect. Behavior, judgment, thought content normal.  Lab Results  Component Value Date   WBC 8.9 02/20/2016   HGB 12.4 02/20/2016   HCT 36.1 02/20/2016   MCV 88.9 02/20/2016   PLT 431* 02/20/2016   Lab Results   Component Value Date   CREATININE 0.53 02/20/2016   BUN 10 02/20/2016   NA 138 02/20/2016   K 3.7 02/20/2016   CL 103 02/20/2016   CO2 23 02/20/2016    No results found for: HGBA1C Lipid Panel  No results found for: CHOL, TRIG, HDL, CHOLHDL, VLDL, LDLCALC     Assessment and plan:   1. Health care maintenance -I have review information presented by the patient and information found in her hospital record.  2. Sepsis, due to unspecified organism (HCC), resolved  - COMPLETE METABOLIC PANEL WITH GFR - CBC with Differential   No Follow-up on file.  The patient was given clear instructions to go to ER or return to medical center if symptoms don't improve, worsen or new problems develop. The patient verbalized understanding.    Henrietta Hoover FNP  02/24/2016, 7:48 AM

## 2016-02-20 NOTE — Patient Instructions (Signed)
[facility] We will let you know if anything in your blood work needs attention.  Follow-up visit in 6 months and sooner if needed.

## 2016-05-03 ENCOUNTER — Inpatient Hospital Stay (HOSPITAL_COMMUNITY)
Admission: AD | Admit: 2016-05-03 | Discharge: 2016-05-04 | Disposition: A | Discharge status: Home / Self Care | Payer: Self-pay | Source: Ambulatory Visit | Attending: Family Medicine | Admitting: Family Medicine

## 2016-05-03 ENCOUNTER — Encounter (HOSPITAL_COMMUNITY): Payer: Self-pay | Admitting: *Deleted

## 2016-05-03 DIAGNOSIS — O26892 Other specified pregnancy related conditions, second trimester: Secondary | ICD-10-CM | POA: Insufficient documentation

## 2016-05-03 DIAGNOSIS — Z3A00 Weeks of gestation of pregnancy not specified: Secondary | ICD-10-CM | POA: Insufficient documentation

## 2016-05-03 DIAGNOSIS — R102 Pelvic and perineal pain: Secondary | ICD-10-CM | POA: Insufficient documentation

## 2016-05-03 DIAGNOSIS — O9989 Other specified diseases and conditions complicating pregnancy, childbirth and the puerperium: Secondary | ICD-10-CM

## 2016-05-03 DIAGNOSIS — N949 Unspecified condition associated with female genital organs and menstrual cycle: Secondary | ICD-10-CM

## 2016-05-03 DIAGNOSIS — R143 Flatulence: Secondary | ICD-10-CM | POA: Insufficient documentation

## 2016-05-03 LAB — URINALYSIS, ROUTINE W REFLEX MICROSCOPIC
Bilirubin Urine: NEGATIVE
Glucose, UA: NEGATIVE mg/dL
Ketones, ur: NEGATIVE mg/dL
Nitrite: NEGATIVE
Protein, ur: NEGATIVE mg/dL
SPECIFIC GRAVITY, URINE: 1.02 (ref 1.005–1.030)
pH: 6 (ref 5.0–8.0)

## 2016-05-03 LAB — URINE MICROSCOPIC-ADD ON

## 2016-05-03 MED ORDER — CYCLOBENZAPRINE HCL 10 MG PO TABS
10.0000 mg | ORAL_TABLET | Freq: Once | ORAL | Status: AC
  Administered 2016-05-03: 10 mg via ORAL
  Filled 2016-05-03: qty 1

## 2016-05-03 MED ORDER — SIMETHICONE 80 MG PO CHEW
160.0000 mg | CHEWABLE_TABLET | Freq: Once | ORAL | Status: AC
  Administered 2016-05-03: 160 mg via ORAL
  Filled 2016-05-03: qty 2

## 2016-05-03 NOTE — Discharge Instructions (Signed)

## 2016-05-03 NOTE — MAU Provider Note (Signed)
History     CSN: [id number]  Arrival date and time: 05/03/16 2140   First Provider Initiated Contact with Patient 05/03/16 2246      Chief Complaint  Patient presents with  . Abdominal Pain   Abdominal Pain  This is a new problem. The current episode started yesterday. The onset quality is gradual. The problem occurs constantly. The problem has been unchanged. The pain is located in the LLQ. The pain is at a severity of 9/10. The quality of the pain is cramping. The abdominal pain radiates to the back. Associated symptoms include nausea. Pertinent negatives include no constipation, diarrhea, dysuria, fever, frequency or vomiting. Nothing aggravates the pain. The pain is relieved by nothing. Treatments tried: Ibuprofen  The treatment provided no relief.    No past medical history on file.  Past Surgical History:  Procedure Laterality Date  . CESAREAN SECTION      No family history on file.  Social History  Substance Use Topics  . Smoking status: Never Smoker  . Smokeless tobacco: Not on file  . Alcohol use No    Allergies: No Known Allergies  Prescriptions Prior to Admission  Medication Sig Dispense Refill Last Dose  . naproxen sodium (ALEVE) 220 MG tablet Take 220 mg by mouth daily as needed. Reported on 02/20/2016   Not Taking  . prenatal vitamin w/FE, FA (PRENATAL 1 + 1) 27-1 MG TABS tablet Take 1 tablet by mouth daily at 12 noon.   Taking    Review of Systems  Constitutional: Negative for chills and fever.  Gastrointestinal: Positive for abdominal pain and nausea. Negative for constipation, diarrhea and vomiting.  Genitourinary: Negative for dysuria, frequency and urgency.   Physical Exam   Blood pressure 106/58, pulse 73, temperature 97.7 F (36.5 C), temperature source Oral, height 5' (1.524 m), weight 180 lb 9 oz (81.9 kg), last menstrual period 01/24/2016.  Physical Exam  Nursing note and vitals reviewed. Constitutional: She is oriented to person, place, and  time. She appears well-developed and well-nourished. No distress.  HENT:  Head: Normocephalic.  Cardiovascular: Normal rate.   Respiratory: Effort normal.  GI: Soft. There is no tenderness. There is no rebound.  Genitourinary:  Genitourinary Comments: FHT 150 with doppler.   Neurological: She is alert and oriented to person, place, and time.  Skin: Skin is warm and dry.  Psychiatric: She has a normal mood and affect.   Results for orders placed or performed during the hospital encounter of 05/03/16 (from the past 24 hour(s))  Urinalysis, Routine w reflex microscopic (not at [facility])     Status: Abnormal   Collection Time: 05/03/16  9:52 PM  Result Value Ref Range   Color, Urine YELLOW YELLOW   APPearance CLEAR CLEAR   Specific Gravity, Urine 1.020 1.005 - 1.030   pH 6.0 5.0 - 8.0   Glucose, UA NEGATIVE NEGATIVE mg/dL   Hgb urine dipstick SMALL (A) NEGATIVE   Bilirubin Urine NEGATIVE NEGATIVE   Ketones, ur NEGATIVE NEGATIVE mg/dL   Protein, ur NEGATIVE NEGATIVE mg/dL   Nitrite NEGATIVE NEGATIVE   Leukocytes, UA TRACE (A) NEGATIVE  Urine microscopic-add on     Status: Abnormal   Collection Time: 05/03/16  9:52 PM  Result Value Ref Range   Squamous Epithelial / LPF 0-5 (A) NONE SEEN   WBC, UA 0-5 0 - 5 WBC/hpf   RBC / HPF 0-5 0 - 5 RBC/hpf   Bacteria, UA FEW (A) NONE SEEN    MAU Course  Procedures  MDM Patient has had simethicone and flexeril. She reports her pain is 0/10   Assessment and Plan   1. Round ligament pain   2. Flatulence    DC home Comfort measures reviewed  2nd Trimester precautions  PTL precautions  Fetal kick counts RX: none  Return to MAU as needed FU with OB as planned  Follow-up Information    [facility] .   Contact information: [address] [place] [address] [phone]            Tawnya Crook 05/03/2016, 10:47 PM

## 2016-05-03 NOTE — MAU Note (Signed)
PT SAYS  WITH INTERPRETER- VIRIA-      SHE WENT  TO HD  ON 6-12-  HAD  POSITIVE  UPT.        SAYS YESTERDAY   BABY  MOVED TO HER LEFT  SIDE.   HAS HAD  BACK  PAIN-.      TOOK 1  ADVIL  AT  9PM  LAST  NIGHT  -  NO RELIEF.    PNC-  PLANNING  ON HD.       LAST SEX-     1 WEEK  AGO.    NO FEVER, NO DIARRHEA.    FELT  NAUSEA.

## 2016-05-03 NOTE — MAU Note (Signed)
SAYS SHE  FEELS  BETTER-  READY  TO GO  HOME

## 2016-10-01 NOTE — L&D Delivery Note (Signed)
Delivery Note At 12:37 AM a viable female was delivered via VBAC, Spontaneous (Presentation:ROA ).  APGAR:9 ,9 ; weight: pending.  Infant dried and lifted to pt's abd; cord clamped and cut by family member. Hospital cord blood sample collected. Placenta status: spont, intact .  Cord:  3 vessel   Anesthesia:  none Episiotomy:  none Lacerations:  none Est. Blood Loss (mL): 50    Mom to postpartum.  Baby to Couplet care / Skin to Skin.  Cam HaiSHAW, KIMBERLY CNM 10/27/2016, 1:07 AM

## 2016-10-11 LAB — OB RESULTS CONSOLE GBS: GBS: NEGATIVE

## 2016-10-26 ENCOUNTER — Encounter (HOSPITAL_COMMUNITY): Payer: Self-pay | Admitting: *Deleted

## 2016-10-26 ENCOUNTER — Inpatient Hospital Stay (HOSPITAL_COMMUNITY)
Admission: AD | Admit: 2016-10-26 | Discharge: 2016-10-28 | DRG: 775 | Disposition: A | Discharge status: Home / Self Care | Payer: Medicaid Other | Source: Ambulatory Visit | Attending: Obstetrics & Gynecology | Admitting: Obstetrics & Gynecology

## 2016-10-26 DIAGNOSIS — O9952 Diseases of the respiratory system complicating childbirth: Secondary | ICD-10-CM | POA: Diagnosis present

## 2016-10-26 DIAGNOSIS — Z3A39 39 weeks gestation of pregnancy: Secondary | ICD-10-CM | POA: Diagnosis not present

## 2016-10-26 DIAGNOSIS — O4202 Full-term premature rupture of membranes, onset of labor within 24 hours of rupture: Secondary | ICD-10-CM | POA: Diagnosis present

## 2016-10-26 DIAGNOSIS — J09X2 Influenza due to identified novel influenza A virus with other respiratory manifestations: Secondary | ICD-10-CM | POA: Diagnosis present

## 2016-10-26 DIAGNOSIS — O34211 Maternal care for low transverse scar from previous cesarean delivery: Secondary | ICD-10-CM | POA: Diagnosis present

## 2016-10-26 DIAGNOSIS — Z3493 Encounter for supervision of normal pregnancy, unspecified, third trimester: Secondary | ICD-10-CM | POA: Diagnosis present

## 2016-10-26 DIAGNOSIS — Z3490 Encounter for supervision of normal pregnancy, unspecified, unspecified trimester: Secondary | ICD-10-CM

## 2016-10-26 LAB — CBC
HCT: 27.2 % — ABNORMAL LOW (ref 36.0–46.0)
Hemoglobin: 9.8 g/dL — ABNORMAL LOW (ref 12.0–15.0)
MCH: 29.8 pg (ref 26.0–34.0)
MCHC: 36 g/dL (ref 30.0–36.0)
MCV: 82.7 fL (ref 78.0–100.0)
PLATELETS: 225 10*3/uL (ref 150–400)
RBC: 3.29 MIL/uL — AB (ref 3.87–5.11)
RDW: 13.1 % (ref 11.5–15.5)
WBC: 4.8 10*3/uL (ref 4.0–10.5)

## 2016-10-26 LAB — TYPE AND SCREEN
ABO/RH(D): B POS
ANTIBODY SCREEN: NEGATIVE

## 2016-10-26 LAB — OB RESULTS CONSOLE RPR: RPR: NONREACTIVE

## 2016-10-26 LAB — OB RESULTS CONSOLE ANTIBODY SCREEN: ANTIBODY SCREEN: NEGATIVE

## 2016-10-26 LAB — OB RESULTS CONSOLE RUBELLA ANTIBODY, IGM: Rubella: IMMUNE

## 2016-10-26 LAB — INFLUENZA PANEL BY PCR (TYPE A & B)
INFLAPCR: POSITIVE — AB
Influenza B By PCR: NEGATIVE

## 2016-10-26 LAB — POCT FERN TEST: POCT FERN TEST: POSITIVE

## 2016-10-26 LAB — GROUP B STREP BY PCR: GROUP B STREP BY PCR: NEGATIVE

## 2016-10-26 LAB — ABO/RH: ABO/RH(D): B POS

## 2016-10-26 LAB — OB RESULTS CONSOLE HEPATITIS B SURFACE ANTIGEN: Hepatitis B Surface Ag: NEGATIVE

## 2016-10-26 LAB — OB RESULTS CONSOLE GC/CHLAMYDIA
CHLAMYDIA, DNA PROBE: NEGATIVE
GC PROBE AMP, GENITAL: NEGATIVE

## 2016-10-26 LAB — OB RESULTS CONSOLE ABO/RH: RH Type: POSITIVE

## 2016-10-26 LAB — OB RESULTS CONSOLE HIV ANTIBODY (ROUTINE TESTING): HIV: NONREACTIVE

## 2016-10-26 MED ORDER — OSELTAMIVIR PHOSPHATE 75 MG PO CAPS
75.0000 mg | ORAL_CAPSULE | Freq: Two times a day (BID) | ORAL | Status: DC
  Administered 2016-10-27 – 2016-10-28 (×4): 75 mg via ORAL
  Filled 2016-10-26 (×5): qty 1

## 2016-10-26 MED ORDER — OXYTOCIN BOLUS FROM INFUSION: 500.0000 mL | Freq: Once | INTRAVENOUS | Status: DC

## 2016-10-26 MED ORDER — OXYTOCIN 40 UNITS IN LACTATED RINGERS INFUSION - SIMPLE MED
2.5000 [IU]/h | INTRAVENOUS | Status: DC
Start: 2016-10-26 — End: 2016-10-27

## 2016-10-26 MED ORDER — SOD CITRATE-CITRIC ACID 500-334 MG/5ML PO SOLN: 30.0000 mL | ORAL | Status: DC | PRN

## 2016-10-26 MED ORDER — OXYTOCIN 40 UNITS IN LACTATED RINGERS INFUSION - SIMPLE MED
1.0000 m[IU]/min | INTRAVENOUS | Status: DC
  Administered 2016-10-26: 2 m[IU]/min via INTRAVENOUS
  Filled 2016-10-26: qty 1000

## 2016-10-26 MED ORDER — ACETAMINOPHEN 325 MG PO TABS: 650.0000 mg | ORAL_TABLET | ORAL | Status: DC | PRN

## 2016-10-26 MED ORDER — OXYCODONE-ACETAMINOPHEN 5-325 MG PO TABS: 1.0000 | ORAL_TABLET | ORAL | Status: DC | PRN

## 2016-10-26 MED ORDER — FENTANYL CITRATE (PF) 100 MCG/2ML IJ SOLN: 100.0000 ug | INTRAMUSCULAR | Status: DC | PRN

## 2016-10-26 MED ORDER — LACTATED RINGERS IV SOLN
INTRAVENOUS | Status: DC
  Administered 2016-10-26: 17:00:00 via INTRAVENOUS

## 2016-10-26 MED ORDER — OXYCODONE-ACETAMINOPHEN 5-325 MG PO TABS: 2.0000 | ORAL_TABLET | ORAL | Status: DC | PRN

## 2016-10-26 MED ORDER — ONDANSETRON HCL 4 MG/2ML IJ SOLN: 4.0000 mg | Freq: Four times a day (QID) | INTRAMUSCULAR | Status: DC | PRN

## 2016-10-26 MED ORDER — FLEET ENEMA 7-19 GM/118ML RE ENEM: 1.0000 | ENEMA | RECTAL | Status: DC | PRN

## 2016-10-26 MED ORDER — TERBUTALINE SULFATE 1 MG/ML IJ SOLN
0.2500 mg | Freq: Once | INTRAMUSCULAR | Status: DC | PRN
  Filled 2016-10-26: qty 1

## 2016-10-26 MED ORDER — OSELTAMIVIR PHOSPHATE 75 MG PO CAPS
75.0000 mg | ORAL_CAPSULE | Freq: Every day | ORAL | Status: DC
  Administered 2016-10-26: 75 mg via ORAL
  Filled 2016-10-26 (×2): qty 1

## 2016-10-26 MED ORDER — LIDOCAINE HCL (PF) 1 % IJ SOLN
30.0000 mL | INTRAMUSCULAR | Status: DC | PRN
  Filled 2016-10-26: qty 30

## 2016-10-26 MED ORDER — LACTATED RINGERS IV SOLN: 500.0000 mL | INTRAVENOUS | Status: DC | PRN

## 2016-10-26 NOTE — MAU Note (Signed)
Pt reports she has been leaking since 3am. No contractions and good fetal movement felt

## 2016-10-26 NOTE — H&P (Signed)
LABOR AND DELIVERY ADMISSION HISTORY AND PHYSICAL NOTE  Anna Perry is a 26 y.o. female G3P0 with IUP at [redacted]w[redacted]d by LMP and first trimester ultrasound presenting after her water broke around 3 am this morning. She is not having any contractions. She reports a gush of water with fluid that was clear.   History is notable for acute pyelonephritis with septic shock in 01/2016 resulting in the patient being placed on prophylactic antibiotic therapy.   She reports positive fetal movement. She has had persistent leakage of fluid. No vaginal bleeding.  Prenatal History/Complications: Previous C-section secondary to breech presentation Recurrent UTI requiring abx ppx  Past Medical History: History reviewed. No pertinent past medical history.  Past Surgical History: Past Surgical History:  Procedure Laterality Date  . CESAREAN SECTION      Obstetrical History: OB History    Gravida Para Term Preterm AB Living   3         2   SAB TAB Ectopic Multiple Live Births           2      Social History: Social History   Social History  . Marital status: Married    Spouse name: N/A  . Number of children: N/A  . Years of education: N/A   Social History Main Topics  . Smoking status: Never Smoker  . Smokeless tobacco: Never Used  . Alcohol use No  . Drug use: No  . Sexual activity: Yes    Birth control/ protection: None   Other Topics Concern  . None   Social History Narrative  . None    Family History: History reviewed. No pertinent family history.  Allergies: No Known Allergies  Prescriptions Prior to Admission  Medication Sig Dispense Refill Last Dose  . prenatal vitamin w/FE, FA (PRENATAL 1 + 1) 27-1 MG TABS tablet Take 1 tablet by mouth daily at 12 noon.   Taking     Review of Systems   All systems reviewed and negative except as stated in HPI  Blood pressure 99/60, pulse 86, temperature 97.4 F (36.3 C), temperature source Oral, resp. rate 18, height 5'  (1.524 m), weight 194 lb 3.2 oz (88.1 kg), last menstrual period 01/24/2016. General appearance: alert, cooperative and no distress Lungs: clear to auscultation bilaterally Heart: regular rate and rhythm Abdomen: soft, non-tender; bowel sounds normal Extremities: No calf swelling or tenderness Presentation: cephalic Fetal monitoring: Baseline in the 140s, +accels, - decels, mod variability Uterine activity: No regular uterine activity Dilation: 5 Effacement (%): 80 Station: -2 Exam by:: K.Wilson,RN   Prenatal labs: ABO, Rh: B/Positive/-- (01/26 1709) Antibody: Negative (01/26 1709) Rubella: Immune RPR: Nonreactive (01/26 1708)  HBsAg: Negative (01/26 1708)  HIV: Non-reactive (01/26 1708)  GBS:   Negative by PCR, repeat PCR and culture pending 1 hr Glucola: Normal Genetic screening: Declined Anatomy US: Normal  Prenatal Transfer Tool  Maternal Diabetes: No Genetic Screening: Declined Maternal Ultrasounds/Referrals: Normal Fetal Ultrasounds or other Referrals:  None Maternal Substance Abuse:  No Significant Maternal Medications:  None Significant Maternal Lab Results: Lab values include: Other: GBS negative by PCR. Culture pending on admission.   Results for orders placed or performed during the hospital encounter of 10/26/16 (from the past 24 hour(s))  [facility] Time: 10/26/16  4:08 PM  Result Value Ref Range   POCT Fern Test Positive = ruptured amniotic membanes   CBC   Collection Time: 10/26/16  5:02 PM  Result Value Ref Range  WBC 4.8 4.0 - 10.5 K/uL   RBC 3.29 (L) 3.87 - 5.11 MIL/uL   Hemoglobin 9.8 (L) 12.0 - 15.0 g/dL   HCT 91.427.2 (L) 78.236.0 - 95.646.0 %   MCV 82.7 78.0 - 100.0 fL   MCH 29.8 26.0 - 34.0 pg   MCHC 36.0 30.0 - 36.0 g/dL   RDW 21.313.1 08.611.5 - 57.815.5 %   Platelets 225 150 - 400 K/uL  OB RESULTS CONSOLE GC/Chlamydia   Collection Time: 10/26/16  5:08 PM  Result Value Ref Range   Gonorrhea Negative    Chlamydia Negative   OB RESULTS CONSOLE RPR    Collection Time: 10/26/16  5:08 PM  Result Value Ref Range   RPR Nonreactive   OB RESULTS CONSOLE HIV antibody   Collection Time: 10/26/16  5:08 PM  Result Value Ref Range   HIV Non-reactive   OB RESULTS CONSOLE Hepatitis B surface antigen   Collection Time: 10/26/16  5:08 PM  Result Value Ref Range   Hepatitis B Surface Ag Negative   OB RESULTS CONSOLE Rubella Antibody   Collection Time: 10/26/16  5:09 PM  Result Value Ref Range   Rubella Immune   OB RESULTS CONSOLE ABO/Rh   Collection Time: 10/26/16  5:09 PM  Result Value Ref Range   RH Type  Positive    ABO Grouping B   OB RESULTS CONSOLE Antibody Screen   Collection Time: 10/26/16  5:09 PM  Result Value Ref Range   Antibody Screen Negative     Patient Active Problem List   Diagnosis Date Noted  . Pregnant and not yet delivered 10/26/2016  . Pyelonephritis 02/05/2016  . Sepsis (HCC) 02/05/2016  . Hypokalemia 02/05/2016    Assessment: Anna Perry is a 26 y.o. G3P0 at 6317w3d here for SROM around 3 am this morning.   #labor: Admitted for expectant management. Plan to initiate Pitocin.  #Pain: IV pain medications initially. Consider epidural when time is appropriate.  #FWB: Cat I #ID:  GBS neg by PCR. No antibiotics at this time. Patient with husband who has tested positive for the flu. Rapid flu swab pending. Tamiflu 75mg  daily as ppx.  #MOF: Breast #MOC: Nexplanon vs IUD #Circ:  N/A  Lise AuerMegan C Campbell, MD PGY-2 10/26/2016, 5:18 PM   OB FELLOW HISTORY AND PHYSICAL ATTESTATION  I have seen and examined this patient; I agree with above documentation in the resident's note. TOLAC consent was given (see below), consent signed at bedside and placed in chart. She has had a successful VBAC about 6 years ago. Previous C/S was for malpresentation, was a LTCS. Husband had arrived and was found to be influenza positive, was asked to go home. Will treat patient prophylactically, also testing for influenza, will  change to BID dosing if she comes back positive.   Trial of Labor after Cesarean Consent  26 y.o. [id number] at 7417w3d with Estimated Date of Delivery: 10/30/16, here for SOL, discussed trial of labor after cesarean section (TOLAC) versus elective repeat cesarean delivery (ERCD). The following risks were discussed with the patient.  Risk of uterine rupture at term is 0.78 percent with TOLAC and 0.22 percent with ERCD. 1 in 10 uterine ruptures will result in neonatal death or neurological injury. The benefits of a trial of labor after cesarean (TOLAC) resulting in a vaginal birth after cesarean (VBAC) include the following: shorter length of hospital stay and postpartum recovery (in most cases); fewer complications, such as postpartum fever, wound or uterine infection, thromboembolism (blood clots in  the leg or lung), need for blood transfusion and fewer neonatal breathing problems. The risks of an attempted VBAC or TOLAC include the following: Risk of failed trial of labor after cesarean (TOLAC) without a vaginal birth after cesarean (VBAC) resulting in repeat cesarean delivery (RCD) in about 20 to 40 percent of women who attempt VBAC.  Risk of rupture of uterus resulting in an emergency cesarean delivery. The risk of uterine rupture may be related in part to the type of uterine incision made during the first cesarean delivery. A previous transverse uterine incision has the lowest risk of rupture (0.2 to 1.5 percent risk). Vertical or T-shaped uterine incisions have a higher risk of uterine rupture (4 to 9 percent risk)The risk of fetal death is very low with both VBAC and elective repeat cesarean delivery (ERCD), but the likelihood of fetal death is higher with VBAC than with ERCD. Maternal death is very rare with either type of delivery. The risks of an elective repeat cesarean delivery (ERCD) were reviewed with the patient including but not limited to: 11/998 risk of uterine rupture which could have serious  consequences, bleeding which may require transfusion; infection which may require antibiotics; injury to bowel, bladder or other surrounding organs (bowel, bladder, ureters); injury to the fetus; need for additional procedures including hysterectomy in the event of a life-threatening hemorrhage; thromboembolic phenomenon; abnormal placentation; incisional problems; death and other postoperative or anesthesia complications.    These risks and benefits are summarized on the consent form, which was reviewed with the patient during the visit. Consent signed at bedside. She would like to proceed with TOLAC.   Jen Mow, DO OB Fellow 10/26/2016, 6:49 PM

## 2016-10-27 ENCOUNTER — Encounter (HOSPITAL_COMMUNITY): Payer: Self-pay | Admitting: *Deleted

## 2016-10-27 DIAGNOSIS — Z3A39 39 weeks gestation of pregnancy: Secondary | ICD-10-CM

## 2016-10-27 DIAGNOSIS — Z8759 Personal history of other complications of pregnancy, childbirth and the puerperium: Secondary | ICD-10-CM

## 2016-10-27 DIAGNOSIS — O34219 Maternal care for unspecified type scar from previous cesarean delivery: Secondary | ICD-10-CM

## 2016-10-27 LAB — RPR: RPR: NONREACTIVE

## 2016-10-27 MED ORDER — ONDANSETRON HCL 4 MG PO TABS: 4.0000 mg | ORAL_TABLET | ORAL | Status: DC | PRN

## 2016-10-27 MED ORDER — ZOLPIDEM TARTRATE 5 MG PO TABS: 5.0000 mg | ORAL_TABLET | Freq: Every evening | ORAL | Status: DC | PRN

## 2016-10-27 MED ORDER — SIMETHICONE 80 MG PO CHEW: 80.0000 mg | CHEWABLE_TABLET | ORAL | Status: DC | PRN

## 2016-10-27 MED ORDER — SENNOSIDES-DOCUSATE SODIUM 8.6-50 MG PO TABS
2.0000 | ORAL_TABLET | ORAL | Status: DC
  Administered 2016-10-27: 2 via ORAL
  Filled 2016-10-27: qty 2

## 2016-10-27 MED ORDER — PRENATAL MULTIVITAMIN CH
1.0000 | ORAL_TABLET | Freq: Every day | ORAL | Status: DC
Start: 2016-10-27 — End: 2016-10-28
  Administered 2016-10-27 – 2016-10-28 (×2): 1 via ORAL
  Filled 2016-10-27 (×2): qty 1

## 2016-10-27 MED ORDER — COCONUT OIL OIL: 1.0000 "application " | TOPICAL_OIL | Status: DC | PRN

## 2016-10-27 MED ORDER — ONDANSETRON HCL 4 MG/2ML IJ SOLN: 4.0000 mg | INTRAMUSCULAR | Status: DC | PRN

## 2016-10-27 MED ORDER — DIBUCAINE 1 % RE OINT: 1.0000 "application " | TOPICAL_OINTMENT | RECTAL | Status: DC | PRN

## 2016-10-27 MED ORDER — DIPHENHYDRAMINE HCL 25 MG PO CAPS: 25.0000 mg | ORAL_CAPSULE | Freq: Four times a day (QID) | ORAL | Status: DC | PRN

## 2016-10-27 MED ORDER — BENZOCAINE-MENTHOL 20-0.5 % EX AERO: 1.0000 "application " | INHALATION_SPRAY | CUTANEOUS | Status: DC | PRN

## 2016-10-27 MED ORDER — ACETAMINOPHEN 325 MG PO TABS
650.0000 mg | ORAL_TABLET | ORAL | Status: DC | PRN
Start: 2016-10-27 — End: 2016-10-28

## 2016-10-27 MED ORDER — WITCH HAZEL-GLYCERIN EX PADS: 1.0000 "application " | MEDICATED_PAD | CUTANEOUS | Status: DC | PRN

## 2016-10-27 MED ORDER — TETANUS-DIPHTH-ACELL PERTUSSIS 5-2.5-18.5 LF-MCG/0.5 IM SUSP: 0.5000 mL | Freq: Once | INTRAMUSCULAR | Status: DC

## 2016-10-27 MED ORDER — IBUPROFEN 600 MG PO TABS
600.0000 mg | ORAL_TABLET | Freq: Four times a day (QID) | ORAL | Status: DC
  Administered 2016-10-27 – 2016-10-28 (×6): 600 mg via ORAL
  Filled 2016-10-27 (×7): qty 1

## 2016-10-27 MED ORDER — OXYCODONE HCL 5 MG PO TABS: 5.0000 mg | ORAL_TABLET | ORAL | Status: DC | PRN

## 2016-10-28 ENCOUNTER — Encounter (HOSPITAL_COMMUNITY): Payer: Self-pay | Admitting: Family

## 2016-10-28 LAB — CULTURE, BETA STREP (GROUP B ONLY)

## 2016-10-28 MED ORDER — OSELTAMIVIR PHOSPHATE 75 MG PO CAPS: 75.0000 mg | ORAL_CAPSULE | Freq: Two times a day (BID) | ORAL | 0 refills | Status: DC

## 2016-10-28 NOTE — Clinical Social Work Maternal (Signed)
CLINICAL SOCIAL WORK MATERNAL/CHILD NOTE  Patient Details  Name: Anna Perry MRN: [id number] Date of Birth: 11-22-1990  Date:  10/28/2016  Clinical Social Worker Initiating Note:  Ferdinand Lango Sanam Marmo, MSW, LCSW-A  Date/ Time Initiated:  10/28/16/1313     Child's Name:  Anna Perry   Legal Guardian:  Other (Comment) (Not established by court system; MOB and FOB( Anna Perry Anna Perry) parent collectively in primary household composition )   Need for Interpreter:  Spanish   Date of Referral:  10/27/16     Reason for Referral:  Other (Comment) (MOB hx of PPD )   Referral Source:  RN   Address:  [place], [place] [address]  Phone number:  [phone]   Household Members:  Self, Spouse, Minor Children   Natural Supports (not living in the home):  Other (Comment) (MOB notes her only support is her husband and three children. MOB denies having additional support nearby as her immediate and extended family do not live close. 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 At the time of this writer's arrival, MOB was tearful sitting on the couch while baby was  asleep in basinet. CSW inquired MOB if she would liek to complete assessment at a later time. MOB notes she can participate now. This Probation officer explained role and reasoning for visit being to assess psychosocial needs and hx of PPD. MOB informed this Probation officer that she experienced PPD in all three of her pregnancies but mainly because she misses her family and wished they lived closer. MOB did not state where they live. MOB noted her symptoms only affected her but not her ability to care for her children. This Probation officer pre-cautioned MOB to be very aware of her emotional/mental state upon d/c being that she has experienced PPD in all of her previous pregnancy's. This Probation officer educated MOB on preventative measures to PPD and the importance of seeking medical attention should she begin to feel onset of symptoms. Additionally, this writer encouraged MOB to utilize her husband as a support as much as needed to prevent PPD worsening if she has onset. MOB verbalized understanding. MOB notes in the past she has never sought out medical attention but instead coped on her own at home and utilized her husband to help her get through it. CSW informed MOB that it is good to utilize her husband; however, it is best to follow-up with a medical provider as well. MOB verbalized understanding. Upon further assessment, MOB disclosed that she is unemployed and uninsured but plans to enroll baby in [place]. This Probation officer informed MOB of the location of Medicaid office in  [facility] and to call ahead of time to schedule and appointment. MOB understood stating that she is familiar with where to go. This Probation officer inquired if MOB's husband is working since she is not working. MOB noted he does work and they use his income to provide for there basic needs such as food/clothing and shelter. MOB notes she is not in need of any resources to help with that as they are prepared for baby's arrival home. This Probation officer praised MOB for being prepared and  having things in place. This Probation officer discussed safe sleeping/SIDS. MOB verbalized understanding.   At this time, this writer has no concerns regarding MOB's ability to care for baby thus no barriers to d/c were found. Case closed to this CSW.   CSW Plan/Description:  Engineer, mining , Information/Referral to [employer] , No Further Intervention Required/No Barriers to Discharge   [employer], MSW, [facility]  Office: [phone]

## 2016-10-28 NOTE — Discharge Summary (Signed)
OB Discharge Summary     Patient Name: Anna Perry DOB: Oct 29, 1990 MRN: [id number]  Date of admission: 10/26/2016 Delivering MD: Cam HaiSHAW, KIMBERLY D   Date of discharge: 10/28/2016  Admitting diagnosis: 38WKS WATER BROKE, LABOR Intrauterine pregnancy: 3189w4d     Secondary diagnosis: Influenza A Additional problems: Influenza A     Discharge diagnosis: VBAC and Influenza A                                                                                                Post partum procedures:Tamiflu  Augmentation: None  Complications: None  Hospital course:  Onset of Labor With Vaginal Delivery     26 y.o. yo G3P2003 at 4289w4d was admitted in Active Labor on 10/26/2016. Patient had an uncomplicated labor course as follows:  Membrane Rupture Time/Date: 3:00 AM ,10/26/2016   Intrapartum Procedures: Episiotomy: None [1]                                         Lacerations:  None [1]  Patient had a delivery of a Viable infant. 10/27/2016  Information for the patient's newborn:  Armando GangMata-Jaramillo, Girl Glean SalenMaribel [[id number]][[id number]]  Delivery Method: VBAC, Spontaneous (Filed from Delivery Summary)    Pateint had a postpartum course complicated by Influenza A.  She is ambulating, tolerating a regular diet, passing flatus, and urinating well. Patient is discharged home in stable condition on 10/28/16.   Physical exam  Vitals:   10/27/16 1728 10/27/16 1734 10/27/16 2020 10/28/16 0535  BP: 105/60  (!) 111/56   Pulse: 70  65   Resp: 20 20 18    Temp: 98.3 F (36.8 C)  98.2 F (36.8 C) 98.2 F (36.8 C)  TempSrc: Oral  Oral Oral  SpO2:  100% 99%   Weight:      Height:       General: alert, cooperative and appears stated age CVS:  RRR, without murmur, gallops, or rubs Lungs:  CTA bilat ABD:  +BSx4, normal Lochia: appropriate Uterine Fundus: firm DVT Evaluation: No evidence of DVT seen on physical exam. Negative Homan's sign.  Labs: Lab Results  Component Value Date   WBC 4.8  10/26/2016   HGB 9.8 (L) 10/26/2016   HCT 27.2 (L) 10/26/2016   MCV 82.7 10/26/2016   PLT 225 10/26/2016   CMP Latest Ref Rng & Units 02/20/2016  Glucose 65 - 99 mg/dL 91  BUN 7 - 25 mg/dL 10  Creatinine 7.820.50 - 9.561.10 mg/dL 2.130.53  Sodium 086135 - 578146 mmol/L 138  Potassium 3.5 - 5.3 mmol/L 3.7  Chloride 98 - 110 mmol/L 103  CO2 20 - 31 mmol/L 23  Calcium 8.6 - 10.2 mg/dL 9.1  Total Protein 6.1 - 8.1 g/dL 7.6  Total Bilirubin 0.2 - 1.2 mg/dL 0.5  Alkaline Phos 33 - 115 U/L 84  AST 10 - 30 U/L 31(H)  ALT 6 - 29 U/L 42(H)    Discharge instruction: per After Visit Summary and "Baby and Me Booklet".  After visit meds:  Allergies as of 10/28/2016   No Known Allergies     Medication List    TAKE these medications   oseltamivir 75 MG capsule Commonly known as:  TAMIFLU Take 1 capsule (75 mg total) by mouth 2 (two) times daily.       Diet: routine diet  Activity: Advance as tolerated. Pelvic rest for 6 weeks.   Outpatient follow up:4 Weeks Follow up Appt:No future appointments. Follow up Visit:No Follow-up on file.  Postpartum contraception: Undecided  Newborn Data: Live born female  Birth Weight: 7 lb 5.8 oz (3340 g) APGAR: 9, 9  Baby Feeding: breast/bottle Disposition:home with mother   10/28/2016 Rochele Pages, CNM

## 2016-10-28 NOTE — Lactation Note (Signed)
This note was copied from a baby's chart. Lactation Consultation Note  Patient Name: Anna Perry UEAVW'UToday's Date: 10/28/2016 Reason for consult: Initial assessment Baby is 7238 hours old & seen by Lactation for initial assessment. Baby was born at 442w4d and weighed 7lbs 5.8 oz at birth. Baby was asleep with mom when LC entered. Used video Spanish interpreter. Mom reports she BF her 2 other children for a little; she stated her first child she did not feel as though she had enough milk and her second child she had PPD so did not feel comfortable BF. Mom reported that she thinks BF is going well with this baby but she is again worried she does not have enough breastmilk so has given formula after BF 2x. Mom reported having pain in her nipples, that mom reports is all the time and not just when baby feeds. Upon observation, mom's left nipple has a positional stripe but her right nipple appeared ok. Mom reports the pain is more in her left nipple. Discussed importance of achieving a deeper latch/ wider mouth to avoid this from occurring. Encouraged mom to express breastmilk after BF and rub into nipples to help with any soreness as well.  Mom given Spanish BF booklet & feeding log; mom made aware of O/P services, breastfeeding support groups, and our phone # for post-discharge questions. Mom encouraged to feed baby 8-12 times/24 hours and with feeding cues. Discussed milk volume/ baby's stomach size. Mom reports she has WIC- encouraged mom to call Monday. Mom wanted a hand pump- gave mom one and discussed how it is meant for occasional use.  Mom reports no questions at this time. Encouraged mom to ask for her nurse or LC at future feeds to assess latch.    Maternal Data    Feeding    LATCH Score/Interventions                      Lactation Tools Discussed/Used     Consult Status Consult Status: Follow-up Date: 10/29/16 Follow-up type: In-patient    Oneal GroutLaura C  [name] 10/28/2016, 3:12 PM

## 2016-10-28 NOTE — Discharge Instructions (Signed)
Parto por cesárea, Cuidados posteriores  °(Cesarean Delivery, Care After) °Siga estas instrucciones durante las próximas semanas. Estas indicaciones le proporcionan información general acerca de cómo deberá cuidarse después del procedimiento. El médico también podrá darle instrucciones específicas. El tratamiento ha sido planificado según las prácticas médicas actuales, pero en algunos casos pueden ocurrir problemas. Comuníquese con el médico si tiene algún problema o tiene preguntas después del procedimiento.  °INSTRUCCIONES PARA EL CUIDADO EN EL HOGAR  °La curación puede demorar algún tiempo. Puede sentir molestias, sensibilidad, hinchazón y hematomas en el sitio de la operación, durante algunas semanas. Esto es normal y mejorará a medida que pase el tiempo.  °Actividad °· Al volver a su casa, durante las 2 primeras semanas descanse siempre que pueda.  °· Siempre que le sea posible, solicite ayuda para realizar las actividades domésticas y para el cuidado del bebé, durante 2 ó 3 semanas.  °· Limite las tareas domésticas y la actividad social. Aumente gradualmente su actividad a medida que recupera la fuerza.  °· No suba escaleras más de 2 o 3 veces por día.  °· No levante nada que sea más pesado que su bebé.  °· Siga las instrucciones de su médico con respecto a conducir automóviles.  °· Consulte con su médico si puede practicar ejercicios.  °Nutrición °· Puede volver a su dieta habitual. Consuma una dieta normal y bien balanceada.  °· Debe ingerir gran cantidad de líquido para mantener la orina de tono claro o color amarillo pálido.  °· Siga tomando los suplementos para el período prenatal o el complejo multivitamínico.  °· No beba alcohol hasta que el médico la autorice.  °Evacuación °Debe retornar a su función intestinal habitual. Si está constipada, consulte a su médico para tomar un laxante suave que la ayudará a ir al baño. Los líquidos y los alimentos que contengan salvado la ayudarán para el problema de la  constipación. Gradualmente agregue frutas, verduras y salvado a su dieta. °Higiene °· Puede darse una ducha, lavarse el cabello y usar la bañera, excepto que su médico le indique otra cosa.  °· Continúe con los cuidados perineales hasta que la secreción vaginal se detenga.  °· No se haga duchas vaginales ni use tampones hasta que el médico la autorice.  °Fiebre °Si se siente afiebrada o tiene escalofríos, tómese la temperatura. La fiebre puede indicar que hay una infección. Las infecciones se tratan con medicamentos.  °Control del Dolor °· Tome sólo medicamentos de venta libre o prescriptos, según las indicaciones del médico. No tome aspirina. Puede ocasionar hemorragias.  °· No conduzca mientras toma analgésicos.  °· Consulte a su médico para volver a tomar o ajustar las dosis de sus medicamentos habituales.  °Cuidados de la Incisión °· Limpie la herida (incisión) suavemente con jabón y agua.  °· Si el médico la autoriza, deje la herida sin vendaje, excepto que observe supuración o irritación.  °· Si tiene pequeñas tiras adhesivas que cruzan la incisión y no se caen dentro de los 7 días, retírelas suavemente.  °· Controle diariamente la incisión y observe si aumenta el enrojecimiento, supura, se hincha o se separa la piel.  °· Abrace una almohada cuando tosa o estornude. Esto ayuda a aliviar el dolor.  °Cuidados Vaginales °La secreción vaginal o la hemorragia pueden durar hasta 6 semanas. Si esta secreción se torna de color rojo brillante, presenta un olor fétido, es demasiado abundante, contiene coágulos o si siente ardor al orinar o debe hacerlo con mucha frecuencia, llame al profesional que la   asiste. Si la hemorragia disminuye y luego se hace ms intensa, es su organismo que le dice que debe disminuir la actividad y relajarse ms. Relaciones Sexuales  Consulte a su mdico antes de reanudar la actividad sexual. [place], luego de 4 a 6 semanas, si se siente bien y descansada, podr reanudar la actividad  sexual. Evite las posiciones que fuercen el sitio de la incisin.   Puede quedar embarazada antes de tener el perodo. Si reanuda las relaciones sexuales, debe utilizar algn mtodo anticonceptivo si no quiere quedar embarazada enseguida.  Prcticas Saludables  [place] con todas las visitas de control, segn le indique su mdico. Generalmente el mdico indicar que vuelva en 2  3 semanas.   Contine con los exmenes plvicos anuales.   Contine con el autoexamen de [employer] y los exmenes anuales que incluyan un [place].  Cuidado de las Mamas  Si no est Mining engineer, y sus Glass blower/designer se tornan sensibles, se endurecen o la [place], puede usar un sostn que le ajuste firmemente y Midwife hielo.   Si est amamantando, use un buen sostn.   Comunquese con el profesional que la asiste si siente dolor en las [place], sntomas similares a una gripe, Systems analyst o endurecimiento y enrojecimiento de las mamas.  Depresin Posparto Despus del entusiasmo por la llegada del beb, generalmente puede suceder un perodo de abatimiento o depresin. Comente sus sensaciones con su pareja, familiares y amigos. Puede estar originado en la modificacin de los niveles de hormonas en el organismo. Si esto la preocupa, puede contactarse con el profesional que la asiste. MISCELNEAS  Limite el uso de bombachas de sostn o medias panty.   Si amamanta, puede ser que no tenga su perodo menstrual por algunos meses o ms. Esto es normal en una mujer que [place]. Si no Air cabin crew de las 6 [employer]  posteriores a la interrupcin de la Transport planner, consulte a su mdico.   Si no est amamantando, debe esperar que comience a Holiday representative de las 6 a 12 semanas posteriores al parto. Si no ha comenzado para la 11 semana, consulte a su mdico.  SOLICITE ATENCIN MDICA SI:  Presenta enrojecimiento, hinchazn o aumento del dolor en la herida.   Aparece pus en la herida.   Advierte un olor ftido que proviene de la  herida o del vendaje.   La zona de la inyeccin intravenosa se hincha, se inflama o duele.   La herida se abre (los bordes no estn unidos).   Se siente mareada o sufre un desmayo.   Siente dolor al Garment/textile technologist u Bennie Hind con [place].   Presenta diarrea.   Presenta nuseas o vmitos.   Margette Fast secrecin vaginal anormal.   Aparece una erupcin cutnea.   Sufre algn tipo de reaccin anormal o alrgica debido a los medicamentos que toma.   El dolor no se [place] con los medicamentos o [place].   Su temperatura es de 101 F (38.3 C), o es 100.4 F (38 C) tomada 2 veces en un perodo de 4 horas.  SOLICITE ATENCIN MDICA DE INMEDIATO SI:  La temperatura se eleva por encima de 102 F (38.9 C).   Siente dolor abdominal.   Siente dolor en el pecho.   Le falta el aire.   Se desmaya.   Presenta dolor, enrojecimiento o hinchazn en las piernas.   Sufre una hemorragia intensa con o sin cogulos de Biochemist, clinical.  Document Released: 09/17/2005 Document Revised: 09/06/2011 [facility] Patient Information 2012 [place].

## 2016-10-29 ENCOUNTER — Ambulatory Visit: Payer: Self-pay

## 2016-10-29 NOTE — Lactation Note (Signed)
This note was copied from a baby's chart. Lactation Consultation Note  Patient Name: Anna Perry JXBJY'NToday's Date: 10/29/2016   Mom is preparing to leave for discharge.  When asked (in Spanish) if she had any questions about breastfeeding, she replied no.   Lurline HareRichey, [name] [facility] 10/29/2016, 11:23 AM

## 2018-01-16 ENCOUNTER — Other Ambulatory Visit: Payer: Self-pay

## 2018-01-16 ENCOUNTER — Ambulatory Visit (HOSPITAL_COMMUNITY)
Admission: EM | Admit: 2018-01-16 | Discharge: 2018-01-16 | Disposition: A | Discharge status: Home / Self Care | Payer: Self-pay | Attending: Family Medicine | Admitting: Family Medicine

## 2018-01-16 ENCOUNTER — Encounter (HOSPITAL_COMMUNITY): Payer: Self-pay | Admitting: Emergency Medicine

## 2018-01-16 DIAGNOSIS — N390 Urinary tract infection, site not specified: Secondary | ICD-10-CM

## 2018-01-16 DIAGNOSIS — R42 Dizziness and giddiness: Secondary | ICD-10-CM

## 2018-01-16 DIAGNOSIS — Z3202 Encounter for pregnancy test, result negative: Secondary | ICD-10-CM

## 2018-01-16 DIAGNOSIS — R110 Nausea: Secondary | ICD-10-CM

## 2018-01-16 LAB — POCT URINALYSIS DIP (DEVICE)
BILIRUBIN URINE: NEGATIVE
Glucose, UA: NEGATIVE mg/dL
Ketones, ur: NEGATIVE mg/dL
NITRITE: NEGATIVE
PH: 7 (ref 5.0–8.0)
Protein, ur: NEGATIVE mg/dL
Specific Gravity, Urine: <=1.005 (ref 1.005–1.030)
Urobilinogen, UA: 0.2 mg/dL (ref 0.0–1.0)

## 2018-01-16 LAB — POCT PREGNANCY, URINE: Preg Test, Ur: NEGATIVE

## 2018-01-16 LAB — GLUCOSE, CAPILLARY: Glucose-Capillary: 70 mg/dL (ref 65–99)

## 2018-01-16 MED ORDER — CEPHALEXIN 500 MG PO CAPS: 500.0000 mg | ORAL_CAPSULE | Freq: Three times a day (TID) | ORAL | 0 refills | Status: AC

## 2018-01-16 MED ORDER — ONDANSETRON HCL 4 MG PO TABS: 4.0000 mg | ORAL_TABLET | Freq: Three times a day (TID) | ORAL | 0 refills | Status: DC | PRN

## 2018-01-16 NOTE — ED Triage Notes (Signed)
Abdominal pain, dizziness.  Provider at bedside

## 2018-01-16 NOTE — Discharge Instructions (Signed)
If you do not feel better after increasing your oral intake of fluids, nausea medications and antibiotics, then you need to be evaluated in the emergency department.

## 2018-01-16 NOTE — ED Provider Notes (Signed)
[facility]    CSN: [id number] Arrival date & time: 01/16/18  1944     History   Chief Complaint Chief Complaint  Patient presents with  . Dizziness    HPI Anna Perry is a 27 y.o. female.   27 year old female, with no significant past medical history, presenting today complaining of abdominal pain and dizziness.  States that she has had 2 days of dizziness and nausea.  States that she has also had some left-sided pelvic pain that started today.  This is a dizziness is associated with the nausea.  Last menstrual.  Was 1.5 weeks ago.  Her husband later states that she has been on a diet for the past month and is concerned that her sugar may be low.  Patient is not tachycardic or hypotensive to suggest dehydration as a source of her symptoms.  The history is provided by the patient.  Abdominal Pain  Pain location:  LLQ and LUQ Pain quality: aching   Pain radiates to:  Does not radiate Pain severity:  Moderate Onset quality:  Gradual Duration:  1 day Timing:  Constant Progression:  Unchanged Chronicity:  New Context: not alcohol use, not awakening from sleep, not diet changes, not eating and not laxative use   Relieved by:  Nothing Worsened by:  Movement and position changes Ineffective treatments:  None tried Associated symptoms: nausea   Associated symptoms: no anorexia, no belching, no chest pain, no chills, no constipation, no cough, no diarrhea, no dysuria, no fatigue, no fever, no flatus, no hematemesis, no hematuria, no shortness of breath, no sore throat and no vomiting   Risk factors: no alcohol abuse, no aspirin use, has not had multiple surgeries, no NSAID use and not obese     History reviewed. No pertinent past medical history.  Patient Active Problem List   Diagnosis Date Noted  . Pregnant and not yet delivered 10/26/2016  . Pyelonephritis 02/05/2016  . Sepsis (HCC) 02/05/2016  . Hypokalemia 02/05/2016    Past Surgical History:    Procedure Laterality Date  . CESAREAN SECTION      OB History    Gravida  3   Para  2   Term  2   Preterm  0   AB  0   Living  3     SAB  0   TAB  0   Ectopic  0   Multiple  0   Live Births  3            Home Medications    Prior to Admission medications   Medication Sig Start Date End Date Taking? Authorizing Provider  cephALEXin (KEFLEX) 500 MG capsule Take 1 capsule (500 mg total) by mouth 3 (three) times daily for 7 days. 01/16/18 01/23/18  Jhada Risk C, PA-C  ondansetron (ZOFRAN) 4 MG tablet Take 1 tablet (4 mg total) by mouth every 8 (eight) hours as needed for nausea or vomiting. 01/16/18   Bosten Newstrom C, PA-C  oseltamivir (TAMIFLU) 75 MG capsule Take 1 capsule (75 mg total) by mouth 2 (two) times daily. 10/28/16   Marlis Edelson, CNM    Family History Family History  Problem Relation Age of Onset  . Healthy Mother     Social History Social History   Tobacco Use  . Smoking status: Never Smoker  . Smokeless tobacco: Never Used  Substance Use Topics  . Alcohol use: No  . Drug use: No     Allergies   Patient  has no known allergies.   Review of Systems Review of Systems  Constitutional: Negative for chills, fatigue and fever.  HENT: Negative for ear pain and sore throat.   Eyes: Negative for pain and visual disturbance.  Respiratory: Negative for cough and shortness of breath.   Cardiovascular: Negative for chest pain and palpitations.  Gastrointestinal: Positive for abdominal pain and nausea. Negative for anorexia, constipation, diarrhea, flatus, hematemesis and vomiting.  Genitourinary: Negative for dysuria and hematuria.  Musculoskeletal: Negative for arthralgias and back pain.  Skin: Negative for color change and rash.  Neurological: Positive for dizziness. Negative for seizures and syncope.  All other systems reviewed and are negative.    Physical Exam Triage Vital Signs ED Triage Vitals  Enc Vitals Group     BP       Pulse      Resp      Temp      Temp src      SpO2      Weight      Height      Head Circumference      Peak Flow      Pain Score      Pain Loc      Pain Edu?      Excl. in GC?    No data found.  Updated Vital Signs BP 100/68 (BP Location: Left Arm)   Pulse 75   Temp 97.8 F (36.6 C) (Oral)   Resp 18   LMP 01/09/2018   SpO2 100%   Visual Acuity Right Eye Distance:   Left Eye Distance:   Bilateral Distance:    Right Eye Near:   Left Eye Near:    Bilateral Near:     Physical Exam  Constitutional: She appears well-developed and well-nourished. No distress.  HENT:  Head: Normocephalic and atraumatic.  Eyes: Conjunctivae are normal.  Neck: Neck supple.  Cardiovascular: Normal rate and regular rhythm.  No murmur heard. Pulmonary/Chest: Effort normal and breath sounds normal. No respiratory distress.  Abdominal: Soft. There is tenderness in the left lower quadrant.  Musculoskeletal: She exhibits no edema.  Neurological: She is alert. She has normal strength. No cranial nerve deficit or sensory deficit. She displays a negative Romberg sign. GCS eye subscore is 4. GCS verbal subscore is 5. GCS motor subscore is 6.  Skin: Skin is warm and dry.  Psychiatric: She has a normal mood and affect.  Nursing note and vitals reviewed.    UC Treatments / Results  Labs (all labs ordered are listed, but only abnormal results are displayed) Labs Reviewed  POCT URINALYSIS DIP (DEVICE) - Abnormal; Notable for the following components:      Result Value   Hgb urine dipstick TRACE (*)    Leukocytes, UA TRACE (*)    All other components within normal limits  GLUCOSE, CAPILLARY  POCT PREGNANCY, URINE    EKG None Radiology No results found.  Procedures Procedures (including critical care time)  Medications Ordered in UC Medications - No data to display   Initial Impression / Assessment and Plan / UC Course  I have reviewed the triage vital signs and the nursing  notes.  Pertinent labs & imaging results that were available during my care of the patient were reviewed by me and considered in my medical decision making (see chart for details).     Negative pregnancy test.  Patient does have urinary tract infection.  Is complaining of dizziness described as lightheadedness along with nausea. We will  treat with antibiotics and nausea medication.  Patient is complaining of orthostatic symptoms.  Recommend increase oral intake of fluids.  If she is not feeling better in the next day or so, then recommend she be evaluated by primary care or in the emergency department.  Final Clinical Impressions(s) / UC Diagnoses   Final diagnoses:  Acute UTI  Nausea without vomiting  Lightheadedness    ED Discharge Orders        Ordered    cephALEXin (KEFLEX) 500 MG capsule  3 times daily     01/16/18 2059    ondansetron (ZOFRAN) 4 MG tablet  Every 8 hours PRN     01/16/18 2059       Controlled Substance Prescriptions [place] Controlled Substance Registry consulted? Not Applicable   Alecia Lemming, [place] 01/16/18 2101

## 2020-07-13 ENCOUNTER — Other Ambulatory Visit: Payer: Self-pay

## 2020-07-13 ENCOUNTER — Ambulatory Visit (HOSPITAL_COMMUNITY)
Admission: EM | Admit: 2020-07-13 | Discharge: 2020-07-13 | Disposition: A | Discharge status: Home / Self Care | Payer: HRSA Program | Attending: Internal Medicine | Admitting: Internal Medicine

## 2020-07-13 ENCOUNTER — Encounter (HOSPITAL_COMMUNITY): Payer: Self-pay | Admitting: Emergency Medicine

## 2020-07-13 DIAGNOSIS — R059 Cough, unspecified: Secondary | ICD-10-CM | POA: Insufficient documentation

## 2020-07-13 DIAGNOSIS — R109 Unspecified abdominal pain: Secondary | ICD-10-CM | POA: Diagnosis not present

## 2020-07-13 DIAGNOSIS — Z20822 Contact with and (suspected) exposure to covid-19: Secondary | ICD-10-CM | POA: Insufficient documentation

## 2020-07-13 DIAGNOSIS — R112 Nausea with vomiting, unspecified: Secondary | ICD-10-CM | POA: Insufficient documentation

## 2020-07-13 DIAGNOSIS — J069 Acute upper respiratory infection, unspecified: Secondary | ICD-10-CM | POA: Insufficient documentation

## 2020-07-13 MED ORDER — ONDANSETRON HCL 4 MG PO TABS: 4.0000 mg | ORAL_TABLET | Freq: Three times a day (TID) | ORAL | 0 refills | Status: DC | PRN

## 2020-07-13 MED ORDER — PROMETHAZINE-DM 6.25-15 MG/5ML PO SYRP: 5.0000 mL | ORAL_SOLUTION | Freq: Four times a day (QID) | ORAL | 0 refills | Status: DC | PRN

## 2020-07-13 NOTE — ED Triage Notes (Signed)
Pt reports with a cough,  Vomiting, headache, stomach ache and nausea x 1 week. She states she has been taking ibuprofen.

## 2020-07-13 NOTE — ED Provider Notes (Signed)
[facility]    CSN: [id number] Arrival date & time: 07/13/20  1624      History   Chief Complaint Chief Complaint  Patient presents with  . Cough  . Headache  . Emesis  . Nausea  . Abdominal Pain    HPI Anna Perry is a 29 y.o. female.   Medical translator utilized today for visit with patient's consent. She is here today for evaluation of almost a week of cough, headache N/V, generalized abdominal pain. She denies known fever, CP, SOB, intolerance to PO. Taking tylenol without much relief of her headache. Daughter sick with same sxs.      History reviewed. No pertinent past medical history.  Patient Active Problem List   Diagnosis Date Noted  . Pregnant and not yet delivered 10/26/2016  . Pyelonephritis 02/05/2016  . Sepsis (HCC) 02/05/2016  . Hypokalemia 02/05/2016    Past Surgical History:  Procedure Laterality Date  . CESAREAN SECTION      OB History    Gravida  3   Para  2   Term  2   Preterm  0   AB  0   Living  3     SAB  0   TAB  0   Ectopic  0   Multiple  0   Live Births  3            Home Medications    Prior to Admission medications   Medication Sig Start Date End Date Taking? Authorizing Provider  ondansetron (ZOFRAN) 4 MG tablet Take 1 tablet (4 mg total) by mouth every 8 (eight) hours as needed for nausea or vomiting. 07/13/20   Particia Nearing, PA-C  oseltamivir (TAMIFLU) 75 MG capsule Take 1 capsule (75 mg total) by mouth 2 (two) times daily. 10/28/16   Karim-Rhoades, Kae Heller, CNM  promethazine-dextromethorphan (PROMETHAZINE-DM) 6.25-15 MG/5ML syrup Take 5 mLs by mouth 4 (four) times daily as needed for cough. 07/13/20   Particia Nearing, PA-C    Family History Family History  Problem Relation Age of Onset  . Healthy Mother     Social History Social History   Tobacco Use  . Smoking status: Never Smoker  . Smokeless tobacco: Never Used  Substance Use Topics  . Alcohol  use: No  . Drug use: No     Allergies   Patient has no known allergies.   Review of Systems Review of Systems PER HPI   Physical Exam Triage Vital Signs ED Triage Vitals  Enc Vitals Group     BP 07/13/20 1753 101/64     Pulse Rate 07/13/20 1753 64     Resp 07/13/20 1753 18     Temp 07/13/20 1753 98.5 F (36.9 C)     Temp Source 07/13/20 1753 Oral     SpO2 07/13/20 1753 100 %     Weight --      Height --      Head Circumference --      Peak Flow --      Pain Score 07/13/20 1748 8     Pain Loc --      Pain Edu? --      Excl. in GC? --    No data found.  Updated Vital Signs BP 101/64 (BP Location: Right Arm)   Pulse 64   Temp 98.5 F (36.9 C) (Oral)   Resp 18   LMP 06/30/2020 (LMP Unknown)   SpO2 100%   Visual Acuity Right  Eye Distance:   Left Eye Distance:   Bilateral Distance:    Right Eye Near:   Left Eye Near:    Bilateral Near:     Physical Exam Vitals and nursing note reviewed.  Constitutional:      Appearance: Normal appearance. She is not ill-appearing.  HENT:     Head: Atraumatic.     Right Ear: Tympanic membrane normal.     Left Ear: Tympanic membrane normal.     Nose: Nose normal.     Mouth/Throat:     Mouth: Mucous membranes are moist.     Pharynx: Posterior oropharyngeal erythema present.  Eyes:     Extraocular Movements: Extraocular movements intact.     Conjunctiva/sclera: Conjunctivae normal.  Cardiovascular:     Rate and Rhythm: Normal rate and regular rhythm.     Heart sounds: Normal heart sounds.  Pulmonary:     Effort: Pulmonary effort is normal.     Breath sounds: Normal breath sounds. No wheezing or rales.  Abdominal:     General: Bowel sounds are normal. There is no distension.     Palpations: Abdomen is soft.     Tenderness: There is no abdominal tenderness. There is no right CVA tenderness, left CVA tenderness or guarding.  Musculoskeletal:        General: Normal range of motion.     Cervical back: Normal range of  motion and neck supple.  Skin:    General: Skin is warm and dry.  Neurological:     Mental Status: She is alert and oriented to person, place, and time.  Psychiatric:        Mood and Affect: Mood normal.        Thought Content: Thought content normal.        Judgment: Judgment normal.      UC Treatments / Results  Labs (all labs ordered are listed, but only abnormal results are displayed) Labs Reviewed  SARS CORONAVIRUS 2 (TAT 6-24 HRS)    EKG   Radiology No results found.  Procedures Procedures (including critical care time)  Medications Ordered in UC Medications - No data to display  Initial Impression / Assessment and Plan / UC Course  I have reviewed the triage vital signs and the nursing notes.  Pertinent labs & imaging results that were available during my care of the patient were reviewed by me and considered in my medical decision making (see chart for details).     Overall well appearing, exam and vitals reassuring. Suspect Viral illness. Will r/o COVID, isolate until results return. ZOfran and phenergan DM sent, supportive home care and return precautions reviewed.    Final Clinical Impressions(s) / UC Diagnoses   Final diagnoses:  Viral URI with cough  Non-intractable vomiting with nausea, unspecified vomiting type   Discharge Instructions   None    ED Prescriptions    Medication Sig Dispense Auth. Provider   ondansetron (ZOFRAN) 4 MG tablet Take 1 tablet (4 mg total) by mouth every 8 (eight) hours as needed for nausea or vomiting. 20 tablet Particia Nearing, [place]   promethazine-dextromethorphan (PROMETHAZINE-DM) 6.25-15 MG/5ML syrup Take 5 mLs by mouth 4 (four) times daily as needed for cough. 100 mL Particia Nearing, [place]     PDMP not reviewed this encounter.   Particia Nearing, [place] 07/13/20 [phone]

## 2020-07-14 LAB — SARS CORONAVIRUS 2 (TAT 6-24 HRS): SARS Coronavirus 2: NEGATIVE

## 2021-01-18 ENCOUNTER — Encounter (HOSPITAL_COMMUNITY): Payer: Self-pay

## 2021-01-18 ENCOUNTER — Emergency Department (HOSPITAL_COMMUNITY): Payer: Self-pay

## 2021-01-18 ENCOUNTER — Ambulatory Visit (HOSPITAL_COMMUNITY)
Admission: EM | Admit: 2021-01-18 | Discharge: 2021-01-18 | Disposition: A | Discharge status: Home / Self Care | Payer: Self-pay

## 2021-01-18 ENCOUNTER — Encounter (HOSPITAL_COMMUNITY): Payer: Self-pay | Admitting: Emergency Medicine

## 2021-01-18 ENCOUNTER — Other Ambulatory Visit: Payer: Self-pay

## 2021-01-18 ENCOUNTER — Emergency Department (HOSPITAL_COMMUNITY)
Admission: EM | Admit: 2021-01-18 | Discharge: 2021-01-18 | Disposition: A | Discharge status: Home / Self Care | Payer: Self-pay | Attending: Emergency Medicine | Admitting: Emergency Medicine

## 2021-01-18 DIAGNOSIS — Z3A00 Weeks of gestation of pregnancy not specified: Secondary | ICD-10-CM | POA: Insufficient documentation

## 2021-01-18 DIAGNOSIS — K6289 Other specified diseases of anus and rectum: Secondary | ICD-10-CM

## 2021-01-18 DIAGNOSIS — R197 Diarrhea, unspecified: Secondary | ICD-10-CM | POA: Insufficient documentation

## 2021-01-18 DIAGNOSIS — R1031 Right lower quadrant pain: Secondary | ICD-10-CM | POA: Insufficient documentation

## 2021-01-18 DIAGNOSIS — O26899 Other specified pregnancy related conditions, unspecified trimester: Secondary | ICD-10-CM | POA: Insufficient documentation

## 2021-01-18 DIAGNOSIS — R6883 Chills (without fever): Secondary | ICD-10-CM | POA: Insufficient documentation

## 2021-01-18 LAB — LIPASE, BLOOD: Lipase: 28 U/L (ref 11–51)

## 2021-01-18 LAB — CBC WITH DIFFERENTIAL/PLATELET
Abs Immature Granulocytes: 0.03 10*3/uL (ref 0.00–0.07)
Basophils Absolute: 0 10*3/uL (ref 0.0–0.1)
Basophils Relative: 0 %
Eosinophils Absolute: 0.1 10*3/uL (ref 0.0–0.5)
Eosinophils Relative: 1 %
HCT: 34.6 % — ABNORMAL LOW (ref 36.0–46.0)
Hemoglobin: 11.8 g/dL — ABNORMAL LOW (ref 12.0–15.0)
Immature Granulocytes: 0 %
Lymphocytes Relative: 24 %
Lymphs Abs: 2 10*3/uL (ref 0.7–4.0)
MCH: 31.1 pg (ref 26.0–34.0)
MCHC: 34.1 g/dL (ref 30.0–36.0)
MCV: 91.1 fL (ref 80.0–100.0)
Monocytes Absolute: 0.5 10*3/uL (ref 0.1–1.0)
Monocytes Relative: 6 %
Neutro Abs: 5.4 10*3/uL (ref 1.7–7.7)
Neutrophils Relative %: 69 %
Platelets: 311 10*3/uL (ref 150–400)
RBC: 3.8 MIL/uL — ABNORMAL LOW (ref 3.87–5.11)
RDW: 12.5 % (ref 11.5–15.5)
WBC: 8.1 10*3/uL (ref 4.0–10.5)
nRBC: 0 % (ref 0.0–0.2)

## 2021-01-18 LAB — COMPREHENSIVE METABOLIC PANEL
ALT: 15 U/L (ref 0–44)
AST: 18 U/L (ref 15–41)
Albumin: 3.8 g/dL (ref 3.5–5.0)
Alkaline Phosphatase: 66 U/L (ref 38–126)
Anion gap: 7 (ref 5–15)
BUN: 5 mg/dL — ABNORMAL LOW (ref 6–20)
CO2: 25 mmol/L (ref 22–32)
Calcium: 8.9 mg/dL (ref 8.9–10.3)
Chloride: 106 mmol/L (ref 98–111)
Creatinine, Ser: 0.56 mg/dL (ref 0.44–1.00)
GFR, Estimated: >60 mL/min (ref >60)
Glucose, Bld: 82 mg/dL (ref 70–99)
Potassium: 3.6 mmol/L (ref 3.5–5.1)
Sodium: 138 mmol/L (ref 135–145)
Total Bilirubin: 0.7 mg/dL (ref 0.3–1.2)
Total Protein: 7.1 g/dL (ref 6.5–8.1)

## 2021-01-18 LAB — URINALYSIS, ROUTINE W REFLEX MICROSCOPIC
Bilirubin Urine: NEGATIVE
Glucose, UA: NEGATIVE mg/dL
Ketones, ur: NEGATIVE mg/dL
Nitrite: POSITIVE — AB
Protein, ur: NEGATIVE mg/dL
Specific Gravity, Urine: 1.017 (ref 1.005–1.030)
pH: 7 (ref 5.0–8.0)

## 2021-01-18 LAB — TYPE AND SCREEN
ABO/RH(D): B POS
Antibody Screen: NEGATIVE

## 2021-01-18 LAB — I-STAT BETA HCG BLOOD, ED (MC, WL, AP ONLY): I-stat hCG, quantitative: <5 m[IU]/mL (ref <5)

## 2021-01-18 MED ORDER — METOCLOPRAMIDE HCL 5 MG/ML IJ SOLN
5.0000 mg | Freq: Once | INTRAMUSCULAR | Status: AC
  Administered 2021-01-18: 5 mg via INTRAVENOUS
  Filled 2021-01-18: qty 2

## 2021-01-18 MED ORDER — METRONIDAZOLE 500 MG PO TABS
500.0000 mg | ORAL_TABLET | Freq: Once | ORAL | Status: AC
  Administered 2021-01-18: 500 mg via ORAL
  Filled 2021-01-18: qty 1

## 2021-01-18 MED ORDER — CIPROFLOXACIN HCL 500 MG PO TABS
500.0000 mg | ORAL_TABLET | Freq: Once | ORAL | Status: AC
  Administered 2021-01-18: 500 mg via ORAL
  Filled 2021-01-18: qty 1

## 2021-01-18 MED ORDER — SODIUM CHLORIDE 0.9 % IV BOLUS
1000.0000 mL | Freq: Once | INTRAVENOUS | Status: AC
  Administered 2021-01-18: 1000 mL via INTRAVENOUS

## 2021-01-18 MED ORDER — FENTANYL CITRATE (PF) 100 MCG/2ML IJ SOLN
50.0000 ug | Freq: Once | INTRAMUSCULAR | Status: AC
  Administered 2021-01-18: 50 ug via INTRAVENOUS
  Filled 2021-01-18: qty 2

## 2021-01-18 MED ORDER — IOHEXOL 300 MG/ML  SOLN
100.0000 mL | Freq: Once | INTRAMUSCULAR | Status: AC | PRN
  Administered 2021-01-18: 100 mL via INTRAVENOUS

## 2021-01-18 MED ORDER — CIPROFLOXACIN HCL 500 MG PO TABS: 500.0000 mg | ORAL_TABLET | Freq: Two times a day (BID) | ORAL | 0 refills | Status: DC

## 2021-01-18 MED ORDER — METRONIDAZOLE 500 MG PO TABS: 500.0000 mg | ORAL_TABLET | Freq: Two times a day (BID) | ORAL | 0 refills | Status: DC

## 2021-01-18 MED ORDER — ONDANSETRON HCL 4 MG PO TABS: 4.0000 mg | ORAL_TABLET | Freq: Three times a day (TID) | ORAL | 0 refills | Status: DC | PRN

## 2021-01-18 NOTE — Discharge Instructions (Signed)
You have some inflammation in your rectum from the diarrhea  Take Cipro and Flagyl as prescribed  Take Zofran for nausea and do not drink alcohol   See your doctor and stay hydrated   Return to ER if you have worse abdominal pain, vomiting, fever

## 2021-01-18 NOTE — ED Triage Notes (Signed)
Pt c/o diarrhea x 3 days. Pt states she is having stomach pain, eye pain, dizziness and headaches. Pt states she noticed blood in her stool. Pt states she did an at home COVID test that resulted negative.

## 2021-01-18 NOTE — ED Notes (Signed)
Patient is being discharged from the Urgent Care and sent to the Emergency Department via pov . Per Chales Salmon- NP, patient is in need of higher level of care due to abdominal pain. Patient is aware and verbalizes understanding of plan of care.  Vitals:   01/18/21 1139  BP: 103/65  Pulse: 91  Resp: 19  Temp: 98.3 F (36.8 C)  SpO2: 97%

## 2021-01-18 NOTE — Discharge Instructions (Signed)
I would recommend that you go to the Emergency Department for further evaluation of your right lower quadrant abdominal pain.

## 2021-01-18 NOTE — ED Provider Notes (Signed)
[facility]    CSN: [id number] Arrival date & time: 01/18/21  1110      History   Chief Complaint Chief Complaint  Patient presents with  . Diarrhea  . Abdominal Pain  . Eye Pain  . Dizziness  . Headache    HPI Anna Perry is a 30 y.o. female.   Patient here for evaluation of abdominal pain and diarrhea that is ongoing for the past 3 days.  Reports noticing some mucus and blood in diarrhea.  Reports taking Advil for pain relief.  Denies any fevers, nausea, or vomiting.  Patient does report fairly generalized abdominal pain but states that today it is worse on the right side. Denies any specific alleviating or aggravating factors.  Denies any fevers, chest pain, shortness of breath, N/V/D, numbness, tingling, weakness, abdominal pain, or headaches.   ROS: As per HPI, all other pertinent ROS negative   The history is provided by the patient.    History reviewed. No pertinent past medical history.  Patient Active Problem List   Diagnosis Date Noted  . Pregnant and not yet delivered 10/26/2016  . Pyelonephritis 02/05/2016  . Sepsis (HCC) 02/05/2016  . Hypokalemia 02/05/2016    Past Surgical History:  Procedure Laterality Date  . CESAREAN SECTION      OB History    Gravida  3   Para  2   Term  2   Preterm  0   AB  0   Living  3     SAB  0   IAB  0   Ectopic  0   Multiple  0   Live Births  3            Home Medications    Prior to Admission medications   Medication Sig Start Date End Date Taking? Authorizing Provider  ondansetron (ZOFRAN) 4 MG tablet Take 1 tablet (4 mg total) by mouth every 8 (eight) hours as needed for nausea or vomiting. 07/13/20   Particia Nearing, PA-C  oseltamivir (TAMIFLU) 75 MG capsule Take 1 capsule (75 mg total) by mouth 2 (two) times daily. 10/28/16   Karim-Rhoades, Kae Heller, CNM  promethazine-dextromethorphan (PROMETHAZINE-DM) 6.25-15 MG/5ML syrup Take 5 mLs by mouth 4 (four) times daily  as needed for cough. 07/13/20   Particia Nearing, PA-C    Family History Family History  Problem Relation Age of Onset  . Healthy Mother     Social History Social History   Tobacco Use  . Smoking status: Never Smoker  . Smokeless tobacco: Never Used  Substance Use Topics  . Alcohol use: No  . Drug use: No     Allergies   Patient has no known allergies.   Review of Systems Review of Systems  Gastrointestinal: Positive for abdominal pain, blood in stool and diarrhea.  All other systems reviewed and are negative.    Physical Exam Triage Vital Signs ED Triage Vitals  Enc Vitals Group     BP 01/18/21 1139 103/65     Pulse Rate 01/18/21 1139 91     Resp 01/18/21 1139 19     Temp 01/18/21 1139 98.3 F (36.8 C)     Temp Source 01/18/21 1139 Oral     SpO2 01/18/21 1139 97 %     Weight --      Height --      Head Circumference --      Peak Flow --      Pain Score 01/18/21 1137  8     Pain Loc --      Pain Edu? --      Excl. in [place]? --    No data found.  Updated Vital Signs BP 103/65 (BP Location: Right Arm)   Pulse 91   Temp 98.3 F (36.8 C) (Oral)   Resp 19   LMP 01/04/2021 (Exact Date)   SpO2 97%   Visual Acuity Right Eye Distance:   Left Eye Distance:   Bilateral Distance:    Right Eye Near:   Left Eye Near:    Bilateral Near:     Physical Exam Vitals and nursing note reviewed.  Constitutional:      General: She is not in acute distress.    Appearance: Normal appearance. She is not ill-appearing, toxic-appearing or diaphoretic.  HENT:     Head: Normocephalic and atraumatic.  Eyes:     Conjunctiva/sclera: Conjunctivae normal.  Cardiovascular:     Rate and Rhythm: Normal rate.     Pulses: Normal pulses.  Pulmonary:     Effort: Pulmonary effort is normal.  Abdominal:     General: Abdomen is flat.     Palpations: Abdomen is soft.     Tenderness: There is abdominal tenderness in the right lower quadrant. There is guarding and rebound.  There is no right CVA tenderness or left CVA tenderness. Positive signs include McBurney's sign.  Musculoskeletal:        General: Normal range of motion.     Cervical back: Normal range of motion.  Skin:    General: Skin is warm and dry.  Neurological:     General: No focal deficit present.     Mental Status: She is alert and oriented to person, place, and time.  Psychiatric:        Mood and Affect: Mood normal.      UC Treatments / Results  Labs (all labs ordered are listed, but only abnormal results are displayed) Labs Reviewed - No data to display  EKG   Radiology No results found.  Procedures Procedures (including critical care time)  Medications Ordered in UC Medications - No data to display  Initial Impression / Assessment and Plan / UC Course  I have reviewed the triage vital signs and the nursing notes.  Pertinent labs & imaging results that were available during my care of the patient were reviewed by me and considered in my medical decision making (see chart for details).     Patient was not able to tolerate full exam and having significant tenderness to the right lower quadrant especially.  As this is concerning for possible appendicitis patient was instructed to go to the emergency room for further evaluation at a higher level of care.  Patient will likely need abdominal CT scan.  Patient okay to go to the emergency room via private vehicle and instructed on how to get there.   Final Clinical Impressions(s) / UC Diagnoses   Final diagnoses:  Right lower quadrant abdominal pain     Discharge Instructions     I would recommend that you go to the Emergency Department for further evaluation of your right lower quadrant abdominal pain.      ED Prescriptions    None     PDMP not reviewed this encounter.   Ivette Loyal, NP 01/18/21 [phone]

## 2021-01-18 NOTE — ED Triage Notes (Signed)
Pt here with c/o abd pain and gi bleed times 3 days , sent to the Ed from, UC

## 2021-01-18 NOTE — ED Provider Notes (Addendum)
[facility] EMERGENCY DEPARTMENT Provider Note   CSN: [id number] Arrival date & time: 01/18/21  1228     History No chief complaint on file.   Anna Perry is a 30 y.o. female.  HPI  Level 5 caveat secondary to language barrier interpreter was used 30 year old female G3, P3 LMP approximately 2 weeks ago using Mirena for pregnancy presents today complaining of abdominal pain and diarrhea.  She reports her diarrhea began about 3 days ago.  It was initially just multiple episodes of loose stool but began having some bloody diarrhea yesterday.  She has had some associated chills.  She denies any sick contacts and has not recently been on antibiotics.  Patient has had pain that has become crampy and is now in her right lower quadrant.  Denies any urinary tract infection symptoms.  Is not taking any medications.    History reviewed. No pertinent past medical history.  Patient Active Problem List   Diagnosis Date Noted  . Pregnant and not yet delivered 10/26/2016  . Pyelonephritis 02/05/2016  . Sepsis (HCC) 02/05/2016  . Hypokalemia 02/05/2016    Past Surgical History:  Procedure Laterality Date  . CESAREAN SECTION       OB History    Gravida  3   Para  2   Term  2   Preterm  0   AB  0   Living  3     SAB  0   IAB  0   Ectopic  0   Multiple  0   Live Births  3           Family History  Problem Relation Age of Onset  . Healthy Mother     Social History   Tobacco Use  . Smoking status: Never Smoker  . Smokeless tobacco: Never Used  Substance Use Topics  . Alcohol use: No  . Drug use: No    Home Medications Prior to Admission medications   Medication Sig Start Date End Date Taking? Authorizing Provider  ondansetron (ZOFRAN) 4 MG tablet Take 1 tablet (4 mg total) by mouth every 8 (eight) hours as needed for nausea or vomiting. 07/13/20   Particia Nearing, PA-C  oseltamivir (TAMIFLU) 75 MG capsule Take 1 capsule  (75 mg total) by mouth 2 (two) times daily. 10/28/16   Karim-Rhoades, Kae Heller, CNM  promethazine-dextromethorphan (PROMETHAZINE-DM) 6.25-15 MG/5ML syrup Take 5 mLs by mouth 4 (four) times daily as needed for cough. 07/13/20   Particia Nearing, PA-C    Allergies    Patient has no known allergies.  Review of Systems   Review of Systems  Constitutional: Positive for chills.  HENT: Negative.   Eyes: Negative.   Respiratory: Negative.   Cardiovascular: Negative.   Gastrointestinal: Positive for abdominal pain, diarrhea and nausea. Negative for vomiting.  Endocrine: Negative.   Genitourinary: Negative.   Musculoskeletal: Negative.   Skin: Negative.   Allergic/Immunologic: Negative.   Neurological: Negative.   Psychiatric/Behavioral: Negative.   All other systems reviewed and are negative.   Physical Exam Updated Vital Signs BP (!) 158/49   Pulse (!) 47   Temp 98.1 F (36.7 C)   Resp 16   LMP 01/04/2021 (Exact Date)   SpO2 95%   Physical Exam Vitals and nursing note reviewed.  Constitutional:      General: She is not in acute distress.    Appearance: She is well-developed.  HENT:     Head: Normocephalic and atraumatic.  Right Ear: External ear normal.     Left Ear: External ear normal.     Nose: Nose normal.  Eyes:     Conjunctiva/sclera: Conjunctivae normal.     Pupils: Pupils are equal, round, and reactive to light.  Cardiovascular:     Rate and Rhythm: Normal rate and regular rhythm.  Pulmonary:     Effort: Pulmonary effort is normal.  Abdominal:     General: Abdomen is flat. There is no distension.     Tenderness: There is abdominal tenderness.     Comments: Diffuse right sided abdominal tenderness to palpation No rebound or masses noted  Musculoskeletal:        General: Normal range of motion.     Cervical back: Normal range of motion and neck supple.  Skin:    General: Skin is warm and dry.  Neurological:     Mental Status: She is alert and  oriented to person, place, and time.     Motor: No abnormal muscle tone.     Coordination: Coordination normal.  Psychiatric:        Behavior: Behavior normal.        Thought Content: Thought content normal.     ED Results / Procedures / Treatments   Labs (all labs ordered are listed, but only abnormal results are displayed) Labs Reviewed  COMPREHENSIVE METABOLIC PANEL  LIPASE, BLOOD  CBC WITH DIFFERENTIAL/PLATELET  URINALYSIS, ROUTINE W REFLEX MICROSCOPIC  I-STAT BETA HCG BLOOD, ED ([facility], [facility], [facility] ONLY)  POC OCCULT BLOOD, ED  TYPE AND SCREEN    EKG None  Radiology No results found.  Procedures Procedures   Medications Ordered in ED Medications - No data to display  ED Course  I have reviewed the triage vital signs and the nursing notes.  Pertinent labs & imaging results that were available during my care of the patient were reviewed by me and considered in my medical decision making (see chart for details).    MDM Rules/Calculators/A&P                          30 yo female with nausea, diarrhea, and rlq pain.  Labs here without acute abnormality except mild leukocytosis.  Urinalysis pending. CT abdomen pending Signed out to Dr. Silverio Lay who assumed care and will follow up remaining labs and imaging Final Clinical Impression(s) / ED Diagnoses Final diagnoses:  RLQ abdominal pain  Diarrhea, unspecified type    Rx / DC Orders ED Discharge Orders    None       Margarita Grizzle, MD 01/18/21 1539    Margarita Grizzle, MD 02/05/21 [phone]

## 2021-01-18 NOTE — ED Triage Notes (Signed)
Emergency Medicine Provider Triage Evaluation Note  Anna Perry , a 30 y.o. female  was evaluated in triage.  Pt complains of abdominal pain.  Pain has been present for the past 3 days and she has intermittently been noticing some blood and mucus in her stool, and has been having diarrhea.  Sent from urgent care for further evaluation.  Review of Systems  Positive: Abdominal pain, diarrhea, dizziness, blood in stool Negative: Fevers, vomiting  Physical Exam  BP 108/72   Pulse 79   Temp 98.7 F (37.1 C)   Resp 15   LMP 01/04/2021 (Exact Date)   SpO2 100%  Gen:   Awake, no distress   HEENT:  Atraumatic  Resp:  Normal effort  Cardiac:  Normal rate  Abd:   Nondistended, tenderness present in the right mid& lower abdomen. MSK:   Moves extremities without difficulty  Neuro:  Speech clear   Medical Decision Making  Medically screening exam initiated at 12:52 PM.  Appropriate orders placed.  Anna Perry was informed that the remainder of the evaluation will be completed by another provider, this initial triage assessment does not replace that evaluation, and the importance of remaining in the ED until their evaluation is complete.  Clinical Impression  1.  Abdominal pain 2.  Blood in stool   Dartha Lodge, [place] 01/18/21 1255

## 2021-01-18 NOTE — ED Provider Notes (Signed)
  Physical Exam  BP 101/61   Pulse 69   Temp 98.1 F (36.7 C)   Resp 16   LMP 01/04/2021 (Exact Date)   SpO2 99%   Physical Exam  ED Course/Procedures     Procedures  MDM  Patient care assumed at 3 PM.  Patient here with diarrhea and dizziness and right lower quadrant pain.  Patient is pending urinalysis and CT abdomen pelvis  5:35 PM  CT showed proctitis.  UA showed nitrates and leukocytes and many bacteria.  I think likely gastroenteritis causing her symptoms.  Will discharge patient home with Cipro and Flagyl.  I updated patient with Spanish interpreter      Charlynne Pander, MD 01/18/21 1736

## 2021-01-19 LAB — TYPE AND SCREEN

## 2021-04-27 IMAGING — CT CT ABD-PELV W/ CM
2 of 4 series · 15 of 46 positions shown, 17 images · IV contrast (omnipaque)
Comparison: None.

CLINICAL DATA: Right lower quadrant abdominal pain

EXAM:
CT ABDOMEN AND PELVIS WITH CONTRAST
TECHNIQUE: Multidetector CT imaging of the abdomen and pelvis was performed
using the standard protocol following bolus administration of
intravenous contrast.
CONTRAST:  100mL OMNIPAQUE IOHEXOL 300 MG/ML  SOLN

[Series 3: abdomen 5.0 · axial · 0.80mm/px · z∈[+828,+1253]mm · 12 of 97 slices shown, 14 images]
[im 6/97  soft-tissue]
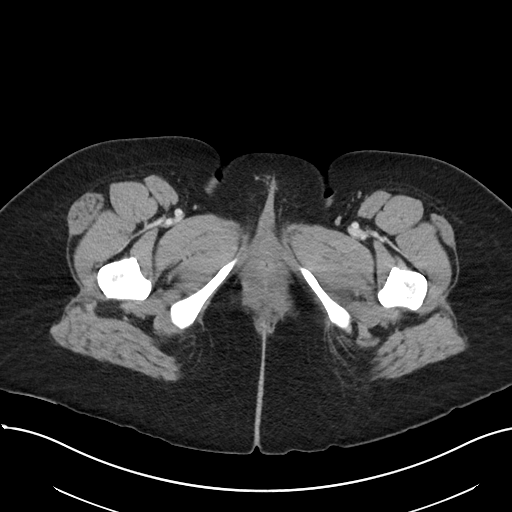
[im 6/97  bone]
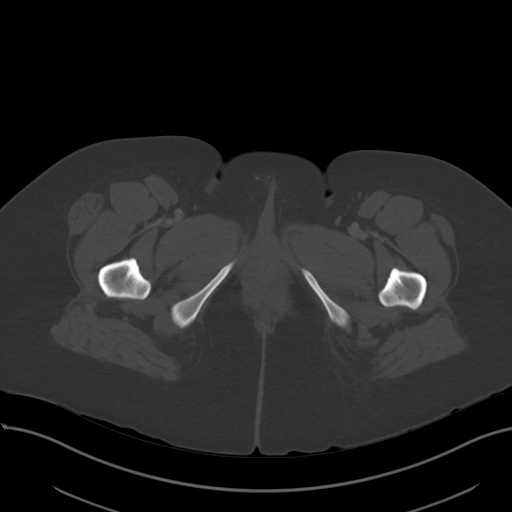
[im 16/97  soft-tissue]
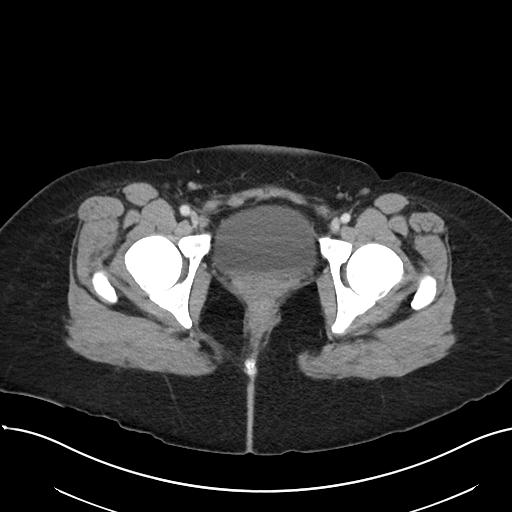
[im 21/97  soft-tissue]
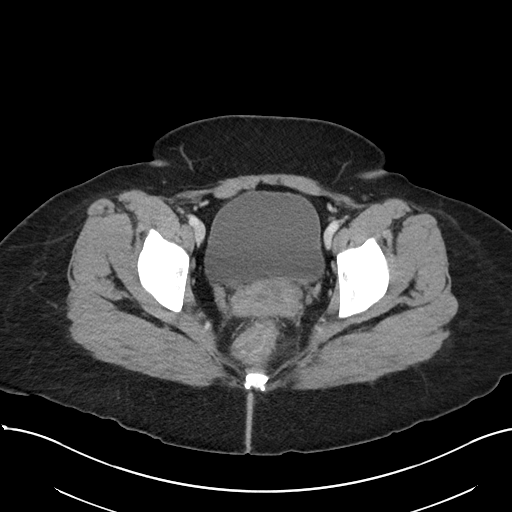
[im 31/97  soft-tissue]
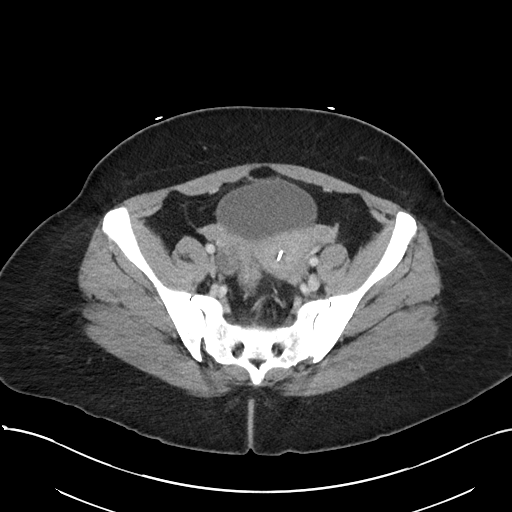
[im 36/97  soft-tissue]
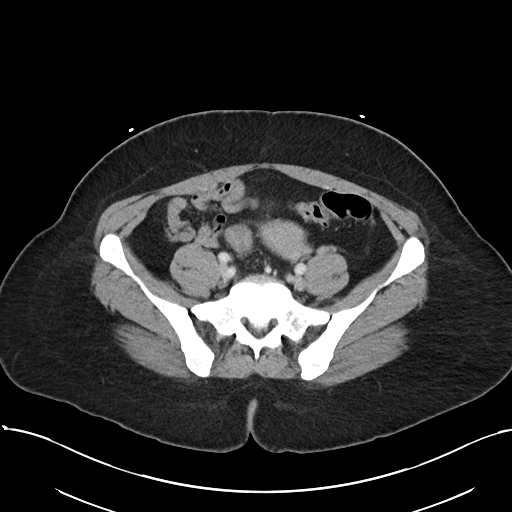
[im 46/97  soft-tissue]
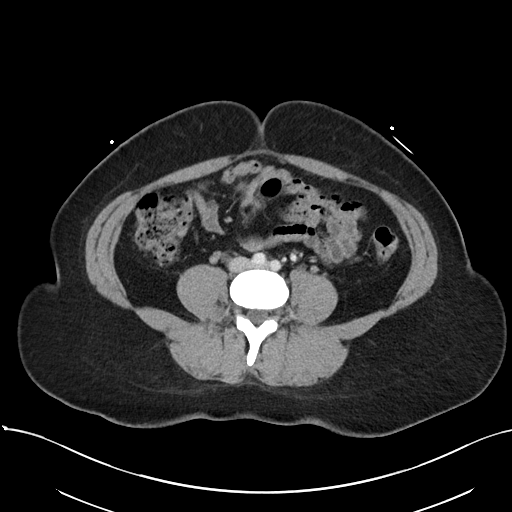
[im 51/97  soft-tissue]
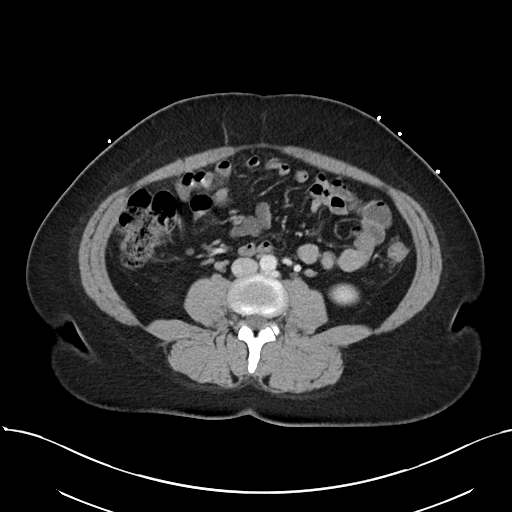
[im 61/97  soft-tissue]
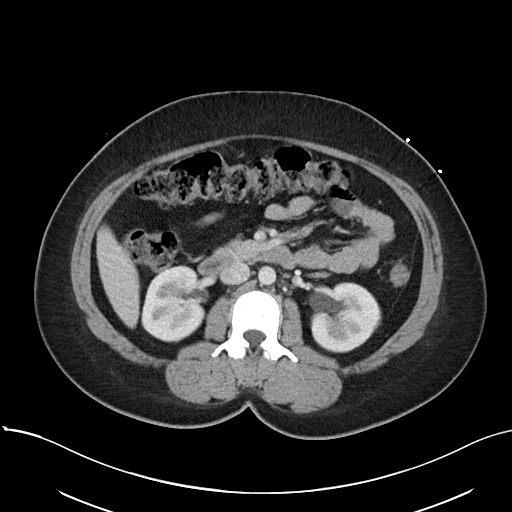
[im 66/97  soft-tissue]
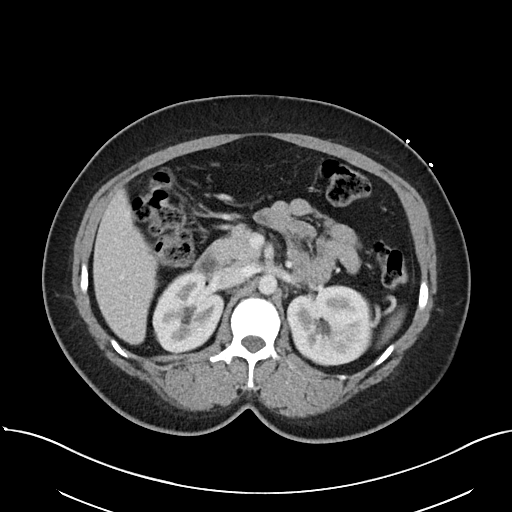
[im 66/97  bone]
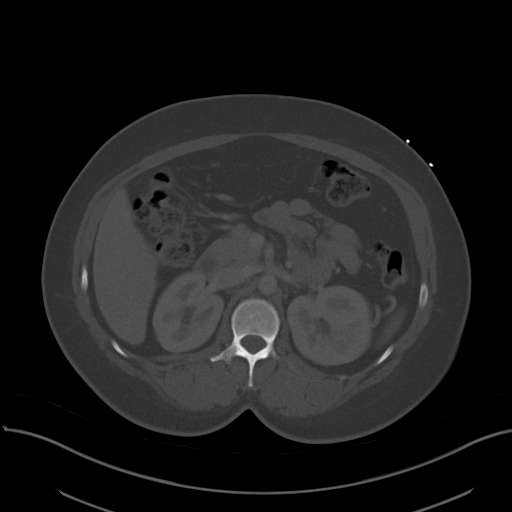
[im 76/97  soft-tissue]
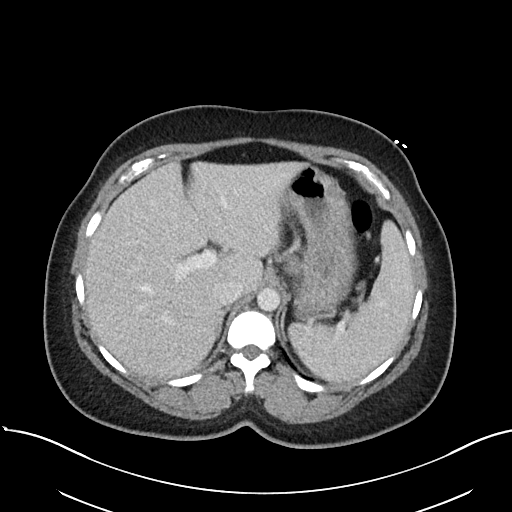
[im 81/97  soft-tissue]
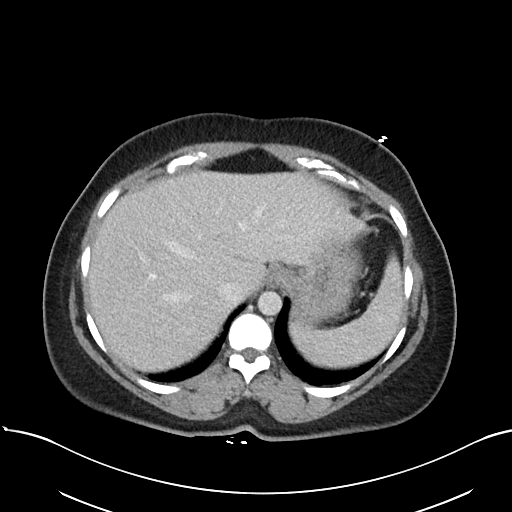
[im 91/97  soft-tissue]
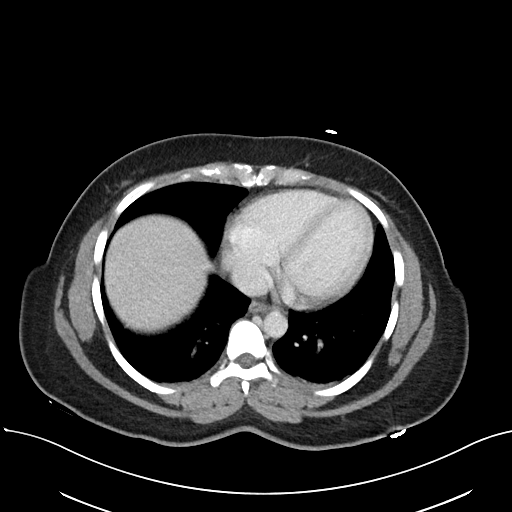

[Series 6: abdomen 3.0 mpr cor · coronal · 0.68mm/px · 3 of 106 slices shown]
[im 36/106  soft-tissue]
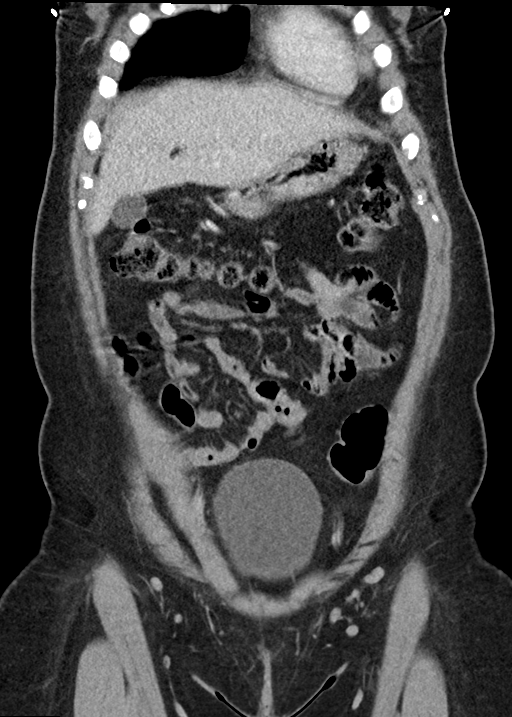
[im 47/106  soft-tissue]
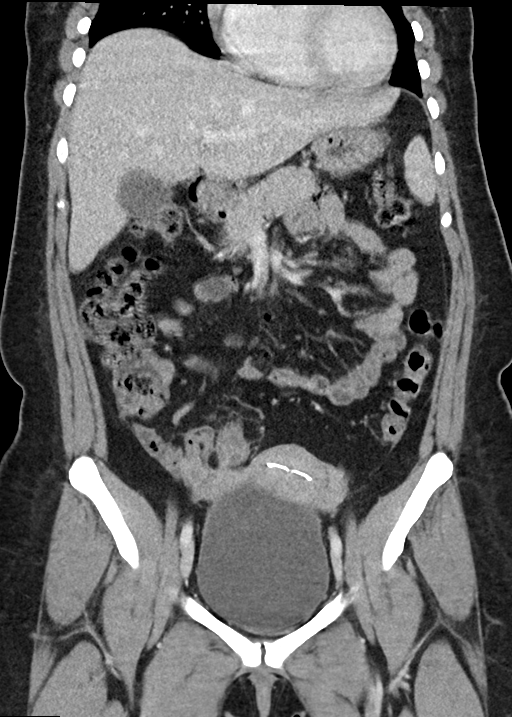
[im 59/106  soft-tissue]
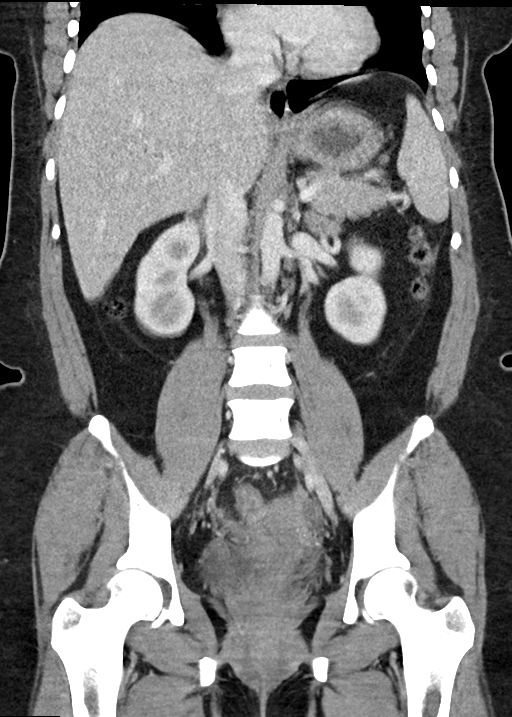

[15 of 46 positions shown; findings below may reference images not displayed]

FINDINGS: Lower chest: Included lung bases are clear.  Heart size is normal.

Hepatobiliary: No focal liver abnormality is seen. No gallstones,
gallbladder wall thickening, or biliary dilatation.

Pancreas: Unremarkable. No pancreatic ductal dilatation or
surrounding inflammatory changes.

Spleen: Normal in size without focal abnormality.

Adrenals/Urinary Tract: Unremarkable adrenal glands. Kidneys enhance
symmetrically without focal lesion, stone, or hydronephrosis.
Ureters are nondilated. Urinary bladder appears unremarkable.

Stomach/Bowel: Stomach is within normal limits. Appendix appears
normal (series 3, images 53-58). Mild diffuse rectal wall
thickening. Remainder of the bowel appears within normal limits. No
dilated loops of bowel to suggest obstruction.

Vascular/Lymphatic: No significant vascular findings are present. No
enlarged abdominal or pelvic lymph nodes.

Reproductive: IUD present within the endometrial canal. Small cyst
and/or follicle within the right adnexa. Left adnexa unremarkable.

Other: Lobulated fluid density structure within the central
mesentery measuring 2.3 x 1.7 x 2.0 cm (series 3, image 47). No
ascites. No organized abdominopelvic fluid collection. No
pneumoperitoneum. No abdominal wall hernia.

Musculoskeletal: No acute or significant osseous findings.
IMPRESSION: 1. Mild diffuse rectal wall thickening suggestive of proctitis.
2. Normal appendix.
3. Incidental note of a lobulated fluid density structure within the
central mesentery measuring up to 2.3 cm, which may represent a
mesenteric lymphangioma. Follow-up contrast enhanced MRI of the
abdomen in 3 months is recommended to further characterize this
finding.

## 2022-11-03 ENCOUNTER — Ambulatory Visit (HOSPITAL_COMMUNITY)
Admission: EM | Admit: 2022-11-03 | Discharge: 2022-11-03 | Disposition: A | Discharge status: Home / Self Care | Payer: BLUE CROSS/BLUE SHIELD | Attending: Physician Assistant | Admitting: Physician Assistant

## 2022-11-03 ENCOUNTER — Encounter (HOSPITAL_COMMUNITY): Payer: Self-pay | Admitting: *Deleted

## 2022-11-03 DIAGNOSIS — N3000 Acute cystitis without hematuria: Secondary | ICD-10-CM

## 2022-11-03 DIAGNOSIS — M545 Low back pain, unspecified: Secondary | ICD-10-CM | POA: Diagnosis not present

## 2022-11-03 LAB — POCT URINALYSIS DIPSTICK, ED / UC
Bilirubin Urine: NEGATIVE
Glucose, UA: NEGATIVE mg/dL
Ketones, ur: NEGATIVE mg/dL
Nitrite: POSITIVE — AB
Protein, ur: 30 mg/dL — AB
Specific Gravity, Urine: 1.025 (ref 1.005–1.030)
Urobilinogen, UA: 1 mg/dL (ref 0.0–1.0)
pH: 6 (ref 5.0–8.0)

## 2022-11-03 LAB — POC URINE PREG, ED: Preg Test, Ur: NEGATIVE

## 2022-11-03 MED ORDER — CIPROFLOXACIN HCL 500 MG PO TABS: 500.0000 mg | ORAL_TABLET | Freq: Two times a day (BID) | ORAL | 0 refills | Status: DC

## 2022-11-03 MED ORDER — PHENAZOPYRIDINE HCL 200 MG PO TABS: 200.0000 mg | ORAL_TABLET | Freq: Three times a day (TID) | ORAL | 0 refills | Status: DC

## 2022-11-03 MED ORDER — IBUPROFEN 600 MG PO TABS: 600.0000 mg | ORAL_TABLET | Freq: Three times a day (TID) | ORAL | 0 refills | Status: DC

## 2022-11-03 NOTE — ED Triage Notes (Signed)
Scientist, water quality service used to intake.    Pt states lower back pain and dysuria X1 week. Pt ahs been using tylenol as needed.

## 2022-11-03 NOTE — ED Provider Notes (Signed)
[name]    CSN: [id number] Arrival date & time: 11/03/22  1103      History   Chief Complaint Chief Complaint  Patient presents with   Back Pain    HPI Anna Perry is a 32 y.o. female.   32 year old female presents with lower back pain left side, frequency and dysuria.  History is being taken through interpretive services with Spanish being the language.  Patient indicates for the past week she has been having persistent and increasing lower back pain mainly on the left with pain radiating into the left lower quadrant of the abdomen.  Patient indicates that it feels sore around her ovary on the left side.  She also indicates she has been having increased frequency, urgency, and dysuria.  She indicates she has been having mild fatigue.  She indicates she has not seen any blood in the urine.  She denies have any fever, chills, nausea or vomiting.  Patient indicates that her last period was 1 January and it was normal.  Patient is tolerating fluids well.   Back Pain   History reviewed. No pertinent past medical history.  Patient Active Problem List   Diagnosis Date Noted   Pregnant and not yet delivered 10/26/2016   Pyelonephritis 02/05/2016   Sepsis ([place]) 02/05/2016   Hypokalemia 02/05/2016    Past Surgical History:  Procedure Laterality Date   CESAREAN SECTION      OB History     Gravida  3   Para  2   Term  2   Preterm  0   AB  0   Living  3      SAB  0   IAB  0   Ectopic  0   Multiple  0   Live Births  3            Home Medications    Prior to Admission medications   Medication Sig Start Date End Date Taking? Authorizing Provider  ibuprofen (ADVIL) 600 MG tablet Take 1 tablet (600 mg total) by mouth 3 (three) times daily. 11/03/22  Yes Nyoka Lint, PA-C  phenazopyridine (PYRIDIUM) 200 MG tablet Take 1 tablet (200 mg total) by mouth 3 (three) times daily. 11/03/22  Yes Nyoka Lint, PA-C  ciprofloxacin (CIPRO) 500 MG  tablet Take 1 tablet (500 mg total) by mouth 2 (two) times daily. One po bid x 7 days 11/03/22   Nyoka Lint, PA-C  ibuprofen (ADVIL) 200 MG tablet Take 200-400 mg by mouth as needed (pain).    [provider]  metroNIDAZOLE (FLAGYL) 500 MG tablet Take 1 tablet (500 mg total) by mouth 2 (two) times daily. One po bid x 7 days 01/18/21   Drenda Freeze, MD  ondansetron [facility]) 4 MG tablet Take 1 tablet (4 mg total) by mouth every 8 (eight) hours as needed for nausea or vomiting. 01/18/21   Drenda Freeze, MD  oseltamivir (TAMIFLU) 75 MG capsule Take 1 capsule (75 mg total) by mouth 2 (two) times daily. Patient not taking: Reported on 01/18/2021 10/28/16   Cyndee Brightly, CNM  promethazine-dextromethorphan (PROMETHAZINE-DM) 6.25-15 MG/5ML syrup Take 5 mLs by mouth 4 (four) times daily as needed for cough. Patient not taking: Reported on 01/18/2021 07/13/20   Volney American, PA-C    Family History Family History  Problem Relation Age of Onset   Healthy Mother     Social History Social History   Tobacco Use   Smoking status: Never  Smokeless tobacco: Never  Vaping Use   Vaping Use: Never used  Substance Use Topics   Alcohol use: No   Drug use: No     Allergies   Patient has no known allergies.   Review of Systems Review of Systems  Musculoskeletal:  Positive for back pain (lower left side).     Physical Exam Triage Vital Signs ED Triage Vitals  Enc Vitals Group     BP 11/03/22 1328 99/67     Pulse Rate 11/03/22 1328 65     Resp 11/03/22 1328 18     Temp 11/03/22 1328 98.3 F (36.8 C)     Temp Source 11/03/22 1328 Oral     SpO2 11/03/22 1328 98 %     Weight --      Height --      Head Circumference --      Peak Flow --      Pain Score 11/03/22 1324 8     Pain Loc --      Pain Edu? --      Excl. in [place]? --    No data found.  Updated Vital Signs BP 99/67 (BP Location: Left Arm)   Pulse 65   Temp 98.3 F (36.8 C) (Oral)   Resp 18    LMP  (LMP Unknown) Comment: pt states she doesnt know dat ebut it was Jan 2024  SpO2 98%   Visual Acuity Right Eye Distance:   Left Eye Distance:   Bilateral Distance:    Right Eye Near:   Left Eye Near:    Bilateral Near:     Physical Exam Constitutional:      Appearance: Normal appearance.  Abdominal:     General: Abdomen is flat. Bowel sounds are normal.     Palpations: Abdomen is soft.     Tenderness: There is abdominal tenderness in the suprapubic area and left lower quadrant. There is no guarding or rebound.  Neurological:     Mental Status: She is alert.      UC Treatments / Results  Labs (all labs ordered are listed, but only abnormal results are displayed) Labs Reviewed  POCT URINALYSIS DIPSTICK, ED / UC - Abnormal; Notable for the following components:      Result Value   Hgb urine dipstick MODERATE (*)    Protein, ur 30 (*)    Nitrite POSITIVE (*)    Leukocytes,Ua MODERATE (*)    All other components within normal limits  URINE CULTURE  POC URINE PREG, ED    EKG   Radiology No results found.  Procedures Procedures (including critical care time)  Medications Ordered in UC Medications - No data to display  Initial Impression / Assessment and Plan / UC Course  I have reviewed the triage vital signs and the nursing notes.  Pertinent labs & imaging results that were available during my care of the patient were reviewed by me and considered in my medical decision making (see chart for details).    Plan: The diagnosis will be treated with the following: 1.  Acute cystitis: A.  Cipro 500 mg every 12 hours to treat infection. B.  Pyridium 200 mg every 6-8 hours to repeat relieve burning and discomfort. C.  Urine culture pending. 2.  Low back pain: A.  Ibuprofen 600 mg every 8 hours with food to help relieve discomfort. 3.  Advised follow-up PCP or return to urgent care as needed. Final Clinical Impressions(s) / UC Diagnoses   Final  diagnoses:   Acute cystitis without hematuria  Acute bilateral low back pain without sciatica     Discharge Instructions      Advised take the Cipro 500 mg every 12 hours until completed to treat the infection. Advised take the Pyridium every 6 hours on a regular basis to treat burning and discomfort.  (This may turn your urine a different color) Advised take ibuprofen 600 mg every 8 hours with food to help relieve pain and discomfort.  Advised to increase fluid intake with water and water based products to try to flush the kidneys and rid the infection.  Advised follow-up PCP or return to urgent care if symptoms fail to improve.    ED Prescriptions     Medication Sig Dispense Auth. Provider   ciprofloxacin (CIPRO) 500 MG tablet  (Status: Discontinued) Take 1 tablet (500 mg total) by mouth 2 (two) times daily. One po bid x 7 days 14 tablet Nyoka Lint, PA-C   phenazopyridine (PYRIDIUM) 200 MG tablet Take 1 tablet (200 mg total) by mouth 3 (three) times daily. 6 tablet Nyoka Lint, PA-C   ibuprofen (ADVIL) 600 MG tablet Take 1 tablet (600 mg total) by mouth 3 (three) times daily. 30 tablet Nyoka Lint, PA-C   ciprofloxacin (CIPRO) 500 MG tablet Take 1 tablet (500 mg total) by mouth 2 (two) times daily. One po bid x 7 days 14 tablet Nyoka Lint, PA-C      PDMP not reviewed this encounter.   Nyoka Lint, PA-C 11/03/22 1427

## 2022-11-03 NOTE — Discharge Instructions (Signed)
Advised take the Cipro 500 mg every 12 hours until completed to treat the infection. Advised take the Pyridium every 6 hours on a regular basis to treat burning and discomfort.  (This may turn your urine a different color) Advised take ibuprofen 600 mg every 8 hours with food to help relieve pain and discomfort.  Advised to increase fluid intake with water and water based products to try to flush the kidneys and rid the infection.  Advised follow-up PCP or return to urgent care if symptoms fail to improve.

## 2022-11-05 LAB — URINE CULTURE: Culture: >=100000 — AB

## 2023-11-13 ENCOUNTER — Emergency Department (HOSPITAL_BASED_OUTPATIENT_CLINIC_OR_DEPARTMENT_OTHER): Payer: No Typology Code available for payment source

## 2023-11-13 ENCOUNTER — Other Ambulatory Visit: Payer: Self-pay

## 2023-11-13 ENCOUNTER — Emergency Department (HOSPITAL_BASED_OUTPATIENT_CLINIC_OR_DEPARTMENT_OTHER)
Admission: EM | Admit: 2023-11-13 | Discharge: 2023-11-13 | Disposition: A | Discharge status: Home / Self Care | Payer: No Typology Code available for payment source | Attending: Emergency Medicine | Admitting: Emergency Medicine

## 2023-11-13 ENCOUNTER — Encounter (HOSPITAL_BASED_OUTPATIENT_CLINIC_OR_DEPARTMENT_OTHER): Payer: Self-pay | Admitting: Emergency Medicine

## 2023-11-13 DIAGNOSIS — R1030 Lower abdominal pain, unspecified: Secondary | ICD-10-CM | POA: Diagnosis present

## 2023-11-13 DIAGNOSIS — N3090 Cystitis, unspecified without hematuria: Secondary | ICD-10-CM | POA: Diagnosis not present

## 2023-11-13 DIAGNOSIS — R109 Unspecified abdominal pain: Secondary | ICD-10-CM

## 2023-11-13 LAB — COMPREHENSIVE METABOLIC PANEL
ALT: 17 U/L (ref 0–44)
AST: 17 U/L (ref 15–41)
Albumin: 4.6 g/dL (ref 3.5–5.0)
Alkaline Phosphatase: 80 U/L (ref 38–126)
Anion gap: 8 (ref 5–15)
BUN: 8 mg/dL (ref 6–20)
CO2: 24 mmol/L (ref 22–32)
Calcium: 9 mg/dL (ref 8.9–10.3)
Chloride: 105 mmol/L (ref 98–111)
Creatinine, Ser: 0.5 mg/dL (ref 0.44–1.00)
GFR, Estimated: >60 mL/min (ref >60)
Glucose, Bld: 85 mg/dL (ref 70–99)
Potassium: 3.7 mmol/L (ref 3.5–5.1)
Sodium: 137 mmol/L (ref 135–145)
Total Bilirubin: 0.5 mg/dL (ref 0.0–1.2)
Total Protein: 7.6 g/dL (ref 6.5–8.1)

## 2023-11-13 LAB — URINALYSIS, ROUTINE W REFLEX MICROSCOPIC
Bilirubin Urine: NEGATIVE
Glucose, UA: NEGATIVE mg/dL
Ketones, ur: NEGATIVE mg/dL
Nitrite: NEGATIVE
Specific Gravity, Urine: 1.025 (ref 1.005–1.030)
pH: 6.5 (ref 5.0–8.0)

## 2023-11-13 LAB — CBC
HCT: 35.6 % — ABNORMAL LOW (ref 36.0–46.0)
Hemoglobin: 12.5 g/dL (ref 12.0–15.0)
MCH: 30.3 pg (ref 26.0–34.0)
MCHC: 35.1 g/dL (ref 30.0–36.0)
MCV: 86.4 fL (ref 80.0–100.0)
Platelets: 349 10*3/uL (ref 150–400)
RBC: 4.12 MIL/uL (ref 3.87–5.11)
RDW: 12.4 % (ref 11.5–15.5)
WBC: 5.3 10*3/uL (ref 4.0–10.5)
nRBC: 0 % (ref 0.0–0.2)

## 2023-11-13 LAB — PREGNANCY, URINE: Preg Test, Ur: NEGATIVE

## 2023-11-13 LAB — LIPASE, BLOOD: Lipase: 14 U/L (ref 11–51)

## 2023-11-13 MED ORDER — KETOROLAC TROMETHAMINE 15 MG/ML IJ SOLN
15.0000 mg | Freq: Once | INTRAMUSCULAR | Status: AC
  Administered 2023-11-13: 15 mg via INTRAVENOUS
  Filled 2023-11-13: qty 1

## 2023-11-13 MED ORDER — PHENAZOPYRIDINE HCL 200 MG PO TABS: 200.0000 mg | ORAL_TABLET | Freq: Three times a day (TID) | ORAL | 0 refills | Status: DC

## 2023-11-13 MED ORDER — CEFADROXIL 500 MG PO CAPS: 500.0000 mg | ORAL_CAPSULE | Freq: Two times a day (BID) | ORAL | 0 refills | Status: DC

## 2023-11-13 MED ORDER — IOHEXOL 300 MG/ML  SOLN
100.0000 mL | Freq: Once | INTRAMUSCULAR | Status: AC | PRN
  Administered 2023-11-13: 100 mL via INTRAVENOUS

## 2023-11-13 MED ORDER — ONDANSETRON HCL 4 MG/2ML IJ SOLN
4.0000 mg | Freq: Once | INTRAMUSCULAR | Status: AC
  Administered 2023-11-13: 4 mg via INTRAVENOUS
  Filled 2023-11-13: qty 2

## 2023-11-13 NOTE — ED Provider Notes (Signed)
[place] EMERGENCY DEPARTMENT AT [facility] Provider Note   CSN: [id number] Arrival date & time: 11/13/23  1005     History  Chief Complaint  Patient presents with   Abdominal Pain    Anna Perry is a 33 y.o. female.   Abdominal Pain   33 year old female presents emergency department with complaints of lower abdominal/flank pain.  Reports symptoms present for the past week or so.  Reports pain in left lower abdomen with some radiation to her left flank.  Reports history of kidney infection as well as kidney stone and states this feels somewhat similar.  Was seen in urgent care 9 days ago and was found to have blood in her urine and was sent home on ciprofloxacin for concern for possible UTI.  Patient reports no improvement of symptoms despite compliance with medication.  States that her pain is improving with Tylenol at home.  Denies any fever, change in bowel habits, hematuria.  Does report that her urine has been slightly malodorous as well as more yellow in appearance.  Reports nausea with a couple episodes of emesis during duration of illness but currently without set symptoms.  States that her last menstrual cycle was at the beginning of the month and is not concerned about pregnancy currently.  Past medical history significant for pyelonephritis  Home Medications Prior to Admission medications   Medication Sig Start Date End Date Taking? Authorizing Provider  cefadroxil (DURICEF) 500 MG capsule Take 1 capsule (500 mg total) by mouth 2 (two) times daily. 11/13/23  Yes Sherian Maroon A, PA  phenazopyridine (PYRIDIUM) 200 MG tablet Take 1 tablet (200 mg total) by mouth 3 (three) times daily. 11/13/23  Yes Sherian Maroon A, PA  ibuprofen (ADVIL) 200 MG tablet Take 200-400 mg by mouth as needed (pain).    [provider]  ibuprofen (ADVIL) 600 MG tablet Take 1 tablet (600 mg total) by mouth 3 (three) times daily. 11/03/22   Ellsworth Lennox, PA-C   metroNIDAZOLE (FLAGYL) 500 MG tablet Take 1 tablet (500 mg total) by mouth 2 (two) times daily. One po bid x 7 days 01/18/21   Charlynne Pander, MD  ondansetron [facility]) 4 MG tablet Take 1 tablet (4 mg total) by mouth every 8 (eight) hours as needed for nausea or vomiting. 01/18/21   Charlynne Pander, MD  promethazine-dextromethorphan (PROMETHAZINE-DM) 6.25-15 MG/5ML syrup Take 5 mLs by mouth 4 (four) times daily as needed for cough. Patient not taking: Reported on 01/18/2021 07/13/20   Particia Nearing, PA-C      Allergies    Patient has no known allergies.    Review of Systems   Review of Systems  Gastrointestinal:  Positive for abdominal pain.  All other systems reviewed and are negative.   Physical Exam Updated Vital Signs BP 97/62 (BP Location: Left Arm)   Pulse 64   Temp 98.4 F (36.9 C) (Oral)   Resp 16   Wt 93.9 kg   SpO2 100%   BMI 40.43 kg/m  Physical Exam Vitals and nursing note reviewed.  Constitutional:      General: She is not in acute distress.    Appearance: She is well-developed.  HENT:     Head: Normocephalic and atraumatic.  Eyes:     Conjunctiva/sclera: Conjunctivae normal.  Cardiovascular:     Rate and Rhythm: Normal rate and regular rhythm.     Heart sounds: No murmur heard. Pulmonary:     Effort: Pulmonary effort is normal. No respiratory  distress.     Breath sounds: Normal breath sounds.  Abdominal:     Palpations: Abdomen is soft.     Tenderness: There is abdominal tenderness in the left lower quadrant. There is left CVA tenderness. There is no right CVA tenderness or guarding.  Musculoskeletal:        General: No swelling.     Cervical back: Neck supple.  Skin:    General: Skin is warm and dry.     Capillary Refill: Capillary refill takes less than 2 seconds.  Neurological:     Mental Status: She is alert.  Psychiatric:        Mood and Affect: Mood normal.     ED Results / Procedures / Treatments   Labs (all labs ordered  are listed, but only abnormal results are displayed) Labs Reviewed  CBC - Abnormal; Notable for the following components:      Result Value   HCT 35.6 (*)    All other components within normal limits  URINALYSIS, ROUTINE W REFLEX MICROSCOPIC - Abnormal; Notable for the following components:   Hgb urine dipstick SMALL (*)    Protein, ur TRACE (*)    Leukocytes,Ua SMALL (*)    Bacteria, UA RARE (*)    All other components within normal limits  URINE CULTURE  LIPASE, BLOOD  COMPREHENSIVE METABOLIC PANEL  PREGNANCY, URINE    EKG None  Radiology CT ABDOMEN PELVIS W CONTRAST Result Date: 11/13/2023 CLINICAL DATA:  Left lower quadrant abdominal pain. EXAM: CT ABDOMEN AND PELVIS WITH CONTRAST TECHNIQUE: Multidetector CT imaging of the abdomen and pelvis was performed using the standard protocol following bolus administration of intravenous contrast. RADIATION DOSE REDUCTION: This exam was performed according to the departmental dose-optimization program which includes automated exposure control, adjustment of the mA and/or kV according to patient size and/or use of iterative reconstruction technique. CONTRAST:  OMNIPAQUE IOHEXOL 300 MG/ML  SOLN COMPARISON:  CT scan abdomen and pelvis from 01/18/2021. FINDINGS: Lower chest: There is a subpleural oval-shaped 1.9 x 4.0 cm opacity in the right lung lower lobe with intervening patent bronchi and crowding of vessels. Findings favor resolving rounded atelectasis. The lung bases are otherwise clear. No pleural effusion. The heart is normal in size. No pericardial effusion. Hepatobiliary: The liver is normal in size. Non-cirrhotic configuration. No suspicious mass. No intrahepatic or extrahepatic bile duct dilation. No calcified gallstones. Normal gallbladder wall thickness. No pericholecystic inflammatory changes. Pancreas: Unremarkable. No pancreatic ductal dilatation or surrounding inflammatory changes. Spleen: Within normal limits. No focal lesion.  Adrenals/Urinary Tract: Adrenal glands are unremarkable. No suspicious renal mass. No hydronephrosis. No renal or ureteric calculi. Urinary bladder is under distended, precluding optimal assessment. However, no large mass or stones identified. No perivesical fat stranding. Stomach/Bowel: No disproportionate dilation of the small or large bowel loops. No evidence of abnormal bowel wall thickening or inflammatory changes. The appendix is unremarkable. Vascular/Lymphatic: No ascites or pneumoperitoneum. Redemonstration of well-circumscribed fluid attenuation structure in the central mesentery with vessels traversing through the structure. 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 Considering > 2 and half years of stability, no routine follow-up is recommended. No abdominal or pelvic lymphadenopathy, by size criteria. No aneurysmal dilation of the major abdominal arteries. Reproductive: The uterus is unremarkable. The ovaries are unremarkable. Other: There is a tiny fat containing umbilical hernia. The soft tissues and abdominal wall are otherwise unremarkable. Musculoskeletal: No suspicious osseous  lesions. IMPRESSION: *No acute inflammatory process identified within the abdomen or pelvis. *Stable previously seen central mesenteric fluid attenuation structure, with imaging characteristics, probable diagnosis and follow-up recommendations, as described above. *Multiple other nonacute observations, as described above. Electronically Signed   By: Jules Schick M.D.   On: 11/13/2023 14:45    Procedures Procedures    Medications Ordered in ED Medications  ketorolac (TORADOL) 15 MG/ML injection 15 mg (15 mg Intravenous Given 11/13/23 1120)  ondansetron (ZOFRAN) injection 4 mg (4 mg Intravenous Given 11/13/23 1119)  iohexol (OMNIPAQUE) 300 MG/ML solution 100 mL (100 mLs Intravenous Contrast Given 11/13/23  1206)    ED Course/ Medical Decision Making/ A&P                                 Medical Decision Making Amount and/or Complexity of Data Reviewed Labs: ordered. Radiology: ordered.  Risk Prescription drug management.   This patient presents to the ED for concern of abdominal/flank pain, this involves an extensive number of treatment options, and is a complaint that carries with it a high risk of complications and morbidity.  The differential diagnosis includes pyelonephritis, nephrolithiasis, cystitis, gastritis, PUD, PID, ectopic pregnancy abscess, SBO/LBO, volvulus, CBD pathology, cholecystitis, gastritis, other   Co morbidities that complicate the patient evaluation  See HPI   Additional history obtained:  Additional history obtained from EMR External records from outside source obtained and reviewed including hospital records   Lab Tests:  I Ordered, and personally interpreted labs.  The pertinent results include: No leukocytosis.  No evidence of anemia.  Placed within range.  No Electra abnormalities.  No transaminitis.  No renal dysfunction.  Lipase within normal limits.  Urine pregnancy negative.  UA with small hemoglobin, trace protein, small leukocytes, 11-20 RBCs, rare bacteria, 0-5 squamous epithelial cells..   Imaging Studies ordered:  I ordered imaging studies including CT abdomen pelvis. I independently visualized and interpreted imaging which showed no acute abnormality.  Stable previously central mesenteric fluid attenuation. I agree with the radiologist interpretation  Cardiac Monitoring: / EKG:  The patient was maintained on a cardiac monitor.  I personally viewed and interpreted the cardiac monitored which showed an underlying rhythm of: Sinus rhythm   Consultations Obtained:  N/a   Problem List / ED Course / Critical interventions / Medication management  UTI, left flank pain I ordered medication including Toradol, Zofran  Reevaluation of  the patient after these medicines showed that the patient improved I have reviewed the patients home medicines and have made adjustments as needed   Social Determinants of Health:  Denies tobacco, illicit drug use   Test / Admission - Considered:  UTI, left flank pain Vitals signs within normal range and stable throughout visit. Laboratory/imaging studies significant for: See above 33 year old female presents emergency department with complaints of lower abdominal/flank pain on the left side.  Symptoms present for the past week or so.  On exam, tenderness left lower quadrant of abdomen as well as left CVA.  Labs reassuring besides UA with concern for possible.  Tract infection with leukocytes, RBCs and bacteria present.  Will culture urine for assessment of bacterial pathogen.  CT scan of patient's abdomen was obtained which was negative for any acute abnormality.  Given patient's malodorous urine, urinary frequency in the setting of appear to be UA, will treat empirically for UTI.  Given flank pain, will extend duration of antibiotic therapy for concern of possible  pyelonephritis despite CT imaging showing no inflammatory changes around left kidney.  Will recommend follow-up with PCP in the outpatient setting.  Treatment plan discussed length with patient and she acknowledged understanding was agreeable to said plan.  Patient overall well-appearing, afebrile in no acute distress. Worrisome signs and symptoms were discussed with the patient, and the patient acknowledged understanding to return to the ED if noticed. Patient was stable upon discharge.          Final Clinical Impression(s) / ED Diagnoses Final diagnoses:  Cystitis  Left flank pain    Rx / DC Orders ED Discharge Orders          Ordered    cefadroxil (DURICEF) 500 MG capsule  2 times daily        11/13/23 1453    phenazopyridine (PYRIDIUM) 200 MG tablet  3 times daily        11/13/23 1453               Peter Garter, [place] 11/13/23 1534    Sloan Leiter, DO 11/14/23 [phone]

## 2023-11-13 NOTE — ED Triage Notes (Signed)
C/o lower ABD pain x 1 week w/ nausea and dizziness.. Denies fever.    Interpretor used.

## 2023-11-13 NOTE — Discharge Instructions (Signed)
As discussed, CT scan did not show obvious kidney stone.  Your urine did look like it could be infected.  Will place on antibiotics for concern for urinary tract infection.  Please take to complete course.  We will culture urine and see what bacteria grows out and call you to change antibiotic if necessary.  Will also send you home with a medicine called Pyridium to help with your symptoms in the meantime.  Recommend following up with your primary care for reassessment.  Please do not hesitate to return if the worrisome signs and symptoms we discussed become apparent.

## 2023-11-15 LAB — URINE CULTURE: Culture: >=100000 — AB

## 2023-11-16 ENCOUNTER — Telehealth (HOSPITAL_BASED_OUTPATIENT_CLINIC_OR_DEPARTMENT_OTHER): Payer: Self-pay | Admitting: *Deleted

## 2023-11-16 NOTE — Progress Notes (Signed)
 ED Antimicrobial Stewardship Positive Culture Follow Up   Anna Perry is an 33 y.o. female who presented to [facility] on 11/13/2023 with a chief complaint of  Chief Complaint  Patient presents with   Abdominal Pain    Recent Results (from the past 720 hours)  Urine Culture     Status: Abnormal   Collection Time: 11/13/23 11:19 AM   Specimen: Urine, Clean Catch  Result Value Ref Range Status   Specimen Description   Final    URINE, CLEAN CATCH Performed at [facility], [address] [place], [place] [address]    Special Requests   Final    NONE Performed at [facility], [address] [place], [place] [address]    Culture >=100,000 COLONIES/mL ENTEROCOCCUS FAECALIS (A)  Final   Report Status 11/15/2023 FINAL  Final   Organism ID, Bacteria ENTEROCOCCUS FAECALIS (A)  Final      Susceptibility   Enterococcus faecalis - MIC*    AMPICILLIN <=2 SENSITIVE Sensitive     NITROFURANTOIN <=16 SENSITIVE Sensitive     VANCOMYCIN 1 SENSITIVE Sensitive     * >=100,000 COLONIES/mL ENTEROCOCCUS FAECALIS    [x]  Treated with cefadroxil, organism resistant to prescribed antimicrobial []  Patient discharged originally without antimicrobial agent and treatment is now indicated  New antibiotic prescription: augmentin 875mg  q12h x 7 days  ED Provider: Jasmine Pang, PA    [name] 11/16/2023, 7:52 AM Clinical Pharmacist Monday - Friday phone -  [phone] Saturday - Sunday phone - [phone]

## 2023-11-16 NOTE — Telephone Encounter (Signed)
 Post ED Visit - Positive Culture Follow-up: Successful Patient Follow-Up  Culture assessed and recommendations reviewed by:  []  Enzo Bi, Pharm.D. []  Celedonio Miyamoto, [address].D., BCPS AQ-ID []  Garvin Fila, Pharm.D., BCPS []  [place]ina Pillion, Pharm.D., BCPS []  [place], [address].D., BCPS, AAHIVP []  Estella Husk, Pharm.D., BCPS, AAHIVP []  Lysle Pearl, PharmD, BCPS []  Phillips Climes, PharmD, BCPS []  Agapito Games, PharmD, BCPS [x] Ivery Quale, PharmD  Positive urine culture  []  Patient discharged without antimicrobial prescription and treatment is now indicated [x]  Organism is resistant to prescribed ED discharge antimicrobial []  Patient with positive blood cultures  Changes discussed with ED provider: Jasmine Pang, PA-C New antibiotic prescription Augmentin 875mg  q12hrs x 7 days Called to [place] [employer] patient, date 11/16/23, time 0849   Patsey Berthold 11/16/2023, 8:50 AM

## 2024-01-22 ENCOUNTER — Ambulatory Visit (INDEPENDENT_AMBULATORY_CARE_PROVIDER_SITE_OTHER): Payer: No Typology Code available for payment source | Admitting: Internal Medicine

## 2024-01-22 ENCOUNTER — Encounter: Payer: Self-pay | Admitting: Internal Medicine

## 2024-01-22 VITALS — BP 100/58 | HR 80 | Temp 98.8°F | Ht 61.0 in | Wt 211.9 lb

## 2024-01-22 DIAGNOSIS — Z1159 Encounter for screening for other viral diseases: Secondary | ICD-10-CM | POA: Diagnosis not present

## 2024-01-22 DIAGNOSIS — Z6841 Body Mass Index (BMI) 40.0 and over, adult: Secondary | ICD-10-CM

## 2024-01-22 DIAGNOSIS — Z0100 Encounter for examination of eyes and vision without abnormal findings: Secondary | ICD-10-CM

## 2024-01-22 DIAGNOSIS — Z0000 Encounter for general adult medical examination without abnormal findings: Secondary | ICD-10-CM | POA: Diagnosis not present

## 2024-01-22 DIAGNOSIS — Z124 Encounter for screening for malignant neoplasm of cervix: Secondary | ICD-10-CM

## 2024-01-22 LAB — VITAMIN D 25 HYDROXY (VIT D DEFICIENCY, FRACTURES): VITD: 12.38 ng/mL — ABNORMAL LOW (ref 30.00–100.00)

## 2024-01-22 LAB — CBC WITH DIFFERENTIAL/PLATELET
Basophils Absolute: 0 10*3/uL (ref 0.0–0.1)
Basophils Relative: 0.5 % (ref 0.0–3.0)
Eosinophils Absolute: 0.2 10*3/uL (ref 0.0–0.7)
Eosinophils Relative: 2.8 % (ref 0.0–5.0)
HCT: 35 % — ABNORMAL LOW (ref 36.0–46.0)
Hemoglobin: 12.2 g/dL (ref 12.0–15.0)
Lymphocytes Relative: 34 % (ref 12.0–46.0)
Lymphs Abs: 2.5 10*3/uL (ref 0.7–4.0)
MCHC: 34.9 g/dL (ref 30.0–36.0)
MCV: 88 fl (ref 78.0–100.0)
Monocytes Absolute: 0.5 10*3/uL (ref 0.1–1.0)
Monocytes Relative: 6.5 % (ref 3.0–12.0)
Neutro Abs: 4.1 10*3/uL (ref 1.4–7.7)
Neutrophils Relative %: 56.2 % (ref 43.0–77.0)
Platelets: 399 10*3/uL (ref 150.0–400.0)
RBC: 3.97 Mil/uL (ref 3.87–5.11)
RDW: 13 % (ref 11.5–15.5)
WBC: 7.2 10*3/uL (ref 4.0–10.5)

## 2024-01-22 LAB — LIPID PANEL
Cholesterol: 168 mg/dL (ref 0–200)
HDL: 35.5 mg/dL — ABNORMAL LOW (ref >39.00)
LDL Cholesterol: 111 mg/dL — ABNORMAL HIGH (ref 0–99)
NonHDL: 132.16
Total CHOL/HDL Ratio: 5
Triglycerides: 107 mg/dL (ref 0.0–149.0)
VLDL: 21.4 mg/dL (ref 0.0–40.0)

## 2024-01-22 LAB — COMPREHENSIVE METABOLIC PANEL WITH GFR
ALT: 15 U/L (ref 0–35)
AST: 15 U/L (ref 0–37)
Albumin: 4.6 g/dL (ref 3.5–5.2)
Alkaline Phosphatase: 86 U/L (ref 39–117)
BUN: 8 mg/dL (ref 6–23)
CO2: 26 meq/L (ref 19–32)
Calcium: 9.4 mg/dL (ref 8.4–10.5)
Chloride: 105 meq/L (ref 96–112)
Creatinine, Ser: 0.58 mg/dL (ref 0.40–1.20)
GFR: 120.39 mL/min (ref >60.00)
Glucose, Bld: 71 mg/dL (ref 70–99)
Potassium: 4.1 meq/L (ref 3.5–5.1)
Sodium: 137 meq/L (ref 135–145)
Total Bilirubin: 0.4 mg/dL (ref 0.2–1.2)
Total Protein: 7.3 g/dL (ref 6.0–8.3)

## 2024-01-22 LAB — VITAMIN B12: Vitamin B-12: 293 pg/mL (ref 211–911)

## 2024-01-22 LAB — HEMOGLOBIN A1C: Hgb A1c MFr Bld: 5.1 % (ref 4.6–6.5)

## 2024-01-22 LAB — TSH: TSH: 1.35 u[IU]/mL (ref 0.35–5.50)

## 2024-01-22 NOTE — Progress Notes (Signed)
 New Patient Office Visit     CC/Reason for Visit: Establish care, annual preventive exam Previous PCP: Unknown Last Visit: Unknown  HPI: Anna Perry is a 33 y.o. female who is coming in today for the above mentioned reasons. Past Medical History is significant for: Morbid obesity.  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  Does not smoke or drink, has no known drug allergies.  She has had 1 C-section.  Her mother had a hysterectomy due to uterine cancer.   Past Medical/Surgical History: History reviewed. No pertinent past medical history.  Past Surgical History:  Procedure Laterality Date   CESAREAN SECTION      Social History:  reports that she has never smoked. She has never used smokeless tobacco. She reports that she does not drink alcohol and does not use drugs.  Allergies: No Known Allergies  Family History:  Family History  Problem Relation Age of Onset   Healthy Mother     No current outpatient medications on file.  Review of Systems:  Negative except as indicated in HPI.   Physical Exam: Vitals:   01/22/24 1429  BP: (!) 100/58  Pulse: 80  Temp: 98.8 F (37.1 C)  TempSrc: Oral  SpO2: 98%  Weight: 211 lb 14.4 oz (96.1 kg)  Height: 5\' 1"  (1.549 m)   Body mass index is 40.04 kg/m.  Physical Exam Vitals reviewed.  Constitutional:      General: She is not in acute distress.    Appearance: Normal appearance. She is obese. She is not ill-appearing, toxic-appearing or diaphoretic.  HENT:     Head: Normocephalic.     Right Ear: Tympanic membrane, ear canal and external ear normal. There is no impacted cerumen.     Left Ear: Tympanic membrane, ear canal and external ear normal. There is no impacted cerumen.     Nose: Nose normal.     Mouth/Throat:     Mouth: Mucous membranes are moist.     Pharynx: Oropharynx is clear. No oropharyngeal exudate or posterior oropharyngeal erythema.  Eyes:     General: No scleral icterus.        Right eye: No discharge.        Left eye: No discharge.     Conjunctiva/sclera: Conjunctivae normal.     Pupils: Pupils are equal, round, and reactive to light.  Neck:     Vascular: No carotid bruit.  Cardiovascular:     Rate and Rhythm: Normal rate and regular rhythm.     Pulses: Normal pulses.     Heart sounds: Normal heart sounds.  Pulmonary:     Effort: Pulmonary effort is normal. No respiratory distress.     Breath sounds: Normal breath sounds.  Abdominal:     General: Abdomen is flat. Bowel sounds are normal.     Palpations: Abdomen is soft.  Musculoskeletal:        General: Normal range of motion.     Cervical back: Normal range of motion.  Skin:    General: Skin is warm and dry.  Neurological:     General: No focal deficit present.     Mental Status: She is alert and oriented to person, place, and time. Mental status is at baseline.  Psychiatric:        Mood and Affect: Mood normal.        Behavior: Behavior normal.        Thought Content: Thought content normal.  Judgment: Judgment normal.       Impression and Plan:  Encounter for preventive health examination  Morbid obesity (HCC) -     CBC with Differential/Platelet; Future -     Comprehensive metabolic panel with GFR; Future -     Hemoglobin A1c; Future -     Lipid panel; Future -     TSH; Future -     Vitamin B12; Future -     VITAMIN D  25 Hydroxy (Vit-D Deficiency, Fractures); Future  Encounter for hepatitis C screening test for low risk patient -     Hepatitis C antibody; Future  Screening for cervical cancer -     Ambulatory referral to Gynecology  Encounter for vision screening -     Ambulatory referral to Ophthalmology  -Recommend routine eye and dental care. -Healthy lifestyle discussed in detail. -Labs to be updated today. -Prostate cancer screening: Not applicable Health Maintenance  Topic Date Due   Hepatitis C Screening  Never done   Pap with HPV screening  Never done    COVID-19 Vaccine (1 - 2024-25 season) Never done   Flu Shot  05/01/2024   DTaP/Tdap/Td vaccine (2 - Td or Tdap) 02/19/2026   HIV Screening  Completed   HPV Vaccine  Aged Out   Meningitis B Vaccine  Aged Out      -Discussed healthy lifestyle, including increased physical activity and better food choices to promote weight loss.     Marguerita Shih, MD [facility]

## 2024-01-27 ENCOUNTER — Encounter: Payer: Self-pay | Admitting: Internal Medicine

## 2024-01-27 ENCOUNTER — Encounter: Payer: Self-pay | Admitting: *Deleted

## 2024-01-27 ENCOUNTER — Other Ambulatory Visit: Payer: Self-pay | Admitting: Internal Medicine

## 2024-01-27 DIAGNOSIS — R768 Other specified abnormal immunological findings in serum: Secondary | ICD-10-CM | POA: Insufficient documentation

## 2024-01-27 DIAGNOSIS — E559 Vitamin D deficiency, unspecified: Secondary | ICD-10-CM

## 2024-01-27 DIAGNOSIS — R7689 Other specified abnormal immunological findings in serum: Secondary | ICD-10-CM | POA: Insufficient documentation

## 2024-01-27 MED ORDER — VITAMIN D (ERGOCALCIFEROL) 1.25 MG (50000 UNIT) PO CAPS: 50000.0000 [IU] | ORAL_CAPSULE | ORAL | 0 refills | Status: AC

## 2024-01-29 LAB — HEPATITIS C ANTIBODY: Hepatitis C Ab: REACTIVE — AB

## 2024-01-29 LAB — HCV RNA,QUANTITATIVE REAL TIME PCR
HCV Quantitative Log: <1.18 {Log_IU}/mL
HCV RNA, PCR, QN: <15 [IU]/mL

## 2024-03-10 ENCOUNTER — Encounter: Payer: Self-pay | Admitting: Obstetrics and Gynecology

## 2024-03-10 ENCOUNTER — Other Ambulatory Visit (HOSPITAL_COMMUNITY)
Admission: RE | Admit: 2024-03-10 | Discharge: 2024-03-10 | Disposition: A | Discharge status: Home / Self Care | Source: Ambulatory Visit | Attending: Obstetrics and Gynecology | Admitting: Obstetrics and Gynecology

## 2024-03-10 ENCOUNTER — Ambulatory Visit (INDEPENDENT_AMBULATORY_CARE_PROVIDER_SITE_OTHER): Payer: Self-pay | Admitting: Obstetrics and Gynecology

## 2024-03-10 VITALS — BP 124/60 | HR 78 | Temp 98.3°F | Ht 61.75 in | Wt 213.0 lb

## 2024-03-10 DIAGNOSIS — Z1331 Encounter for screening for depression: Secondary | ICD-10-CM

## 2024-03-10 DIAGNOSIS — R1032 Left lower quadrant pain: Secondary | ICD-10-CM | POA: Insufficient documentation

## 2024-03-10 DIAGNOSIS — Z124 Encounter for screening for malignant neoplasm of cervix: Secondary | ICD-10-CM | POA: Insufficient documentation

## 2024-03-10 DIAGNOSIS — N946 Dysmenorrhea, unspecified: Secondary | ICD-10-CM | POA: Insufficient documentation

## 2024-03-10 DIAGNOSIS — Z01419 Encounter for gynecological examination (general) (routine) without abnormal findings: Secondary | ICD-10-CM | POA: Insufficient documentation

## 2024-03-10 NOTE — Patient Instructions (Signed)

## 2024-03-10 NOTE — Progress Notes (Signed)
 33 y.o. G74P2003 female here for annual exam. Married. Due to language barrier, an interpreter was present during the history-taking and subsequent discussion (and for part of the physical exam) with this patient.  Patient's last menstrual period was 02/24/2024 (approximate). Period Duration (Days): 5 Period Pattern: Regular Menstrual Flow: Moderate Menstrual Control: Tampon Dysmenorrhea: (!) Mild Dysmenorrhea Symptoms: Nausea  She reports no concerns. FM Hx of maternal uterine vs cervical cancer but unsure of how it was treated. Mother is still living.  Abnormal bleeding: none Pelvic discharge or pain: +ROS, LLQ pain- daily (7/10), worse with menses (10/10), Today 5/10. She has not taken any medications today. She usually take Tylenol  for pain. No N/V, constipation. 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 No pertinent past medical history. Past Surgical History:  Procedure Laterality Date   CESAREAN SECTION     Current Outpatient Medications on File Prior to Visit  Medication Sig Dispense Refill   Vitamin D , Ergocalciferol , (DRISDOL ) 1.25 MG (50000 UNIT) CAPS capsule Take 1 capsule (50,000 Units total) by mouth every 7 (seven) days for 12 doses. 12  capsule 0   No current facility-administered medications on file prior to visit.   Social History   Socioeconomic History   Marital status: Married    Spouse name: Not on file   Number of children: Not on file   Years of education: Not on file   Highest education level: 7th grade  Occupational History   Not on file  Tobacco Use   Smoking status: Never   Smokeless tobacco: Never  Vaping Use   Vaping status: Never Used  Substance and Sexual Activity   Alcohol use: No   Drug use: No   Sexual activity: Yes    Birth control/protection: Condom, None    Comment: sometimes  Other Topics Concern   Not on file  Social History Narrative   Not on file   Social Drivers of Health   Financial Resource Strain: Medium Risk (01/18/2024)   Overall Financial Resource Strain (CARDIA)    Difficulty of Paying Living Expenses: Somewhat hard  Food Insecurity: No Food Insecurity (01/18/2024)   Hunger Vital Sign    Worried About Running Out of Food in the Last Year: Never true    Ran Out of Food in the Last Year: Never true  Transportation Needs: No Transportation Needs (01/18/2024)   PRAPARE - Administrator, Civil Service (Medical): No    Lack of Transportation (Non-Medical): No  Physical Activity: Unknown (01/18/2024)   Exercise Vital  Sign    Days of Exercise per Week: 0 days    Minutes of Exercise per Session: Not on file  Stress: Stress Concern Present (01/18/2024)   [employer] of Occupational Health - Occupational Stress Questionnaire    Feeling of Stress : Very much  Social Connections: Moderately Integrated (01/18/2024)   Social Connection and Isolation Panel [NHANES]    Frequency of Communication with Friends and Family: More than three times a week    Frequency of Social Gatherings with Friends and Family: Twice a week    Attends Religious Services: More than 4 times per year    Active Member of [employer] or Organizations: No    Attends Engineer, structural:  Not on file    Marital Status: Married  Catering manager Violence: Not on file   Family History  Problem Relation Age of Onset   Healthy Mother    No Known Allergies  PE Today's Vitals   03/10/24 1411  BP: 124/60  Pulse: 78  Temp: 98.3 F (36.8 C)  TempSrc: Oral  SpO2: 97%  Weight: 213 lb (96.6 kg)  Height: 5' 1.75" (1.568 m)   Body mass index is 39.27 kg/m.  Physical Exam Vitals reviewed. Exam conducted with a chaperone present.  Constitutional:      General: She is not in acute distress.    Appearance: Normal appearance.  HENT:     Head: Normocephalic and atraumatic.     Nose: Nose normal.  Eyes:     Extraocular Movements: Extraocular movements intact.     Conjunctiva/sclera: Conjunctivae normal.  Neck:     Thyroid: No thyroid mass, thyromegaly or thyroid tenderness.  Pulmonary:     Effort: Pulmonary effort is normal.  Chest:     Chest wall: No mass or tenderness.  Breasts:    Right: Normal. No swelling, mass, nipple discharge, skin change or tenderness.     Left: Normal. No swelling, mass, nipple discharge, skin change or tenderness.  Abdominal:     General: There is no distension.     Palpations: Abdomen is soft.     Tenderness: There is no abdominal tenderness.  Genitourinary:    General: Normal vulva.     Exam position: Lithotomy position.     Urethra: No prolapse.     Vagina: Normal. No vaginal discharge or bleeding.     Cervix: Normal. No lesion.     Uterus: Normal. Not enlarged and not tender.      Adnexa: Right adnexa normal and left adnexa normal.  Musculoskeletal:        General: Normal range of motion.     Cervical back: Normal range of motion.  Lymphadenopathy:     Upper Body:     Right upper body: No axillary adenopathy.     Left upper body: No axillary adenopathy.     Lower Body: No right inguinal adenopathy. No left inguinal adenopathy.  Skin:    General: Skin is warm and dry.  Neurological:     General: No focal deficit present.      Mental Status: She is alert.  Psychiatric:        Mood and Affect: Mood normal.        Behavior: Behavior normal.      Assessment and Plan:        Well woman exam with routine gynecological exam Assessment & Plan: Cervical cancer screening performed according to ASCCP guidelines. Labs and immunizations with her primary Encouraged safe sexual practices as  indicated.  Declined contraception at this time. Encouraged healthy lifestyle practices with diet and exercise For patients under 50yo, I recommend 1000mg  calcium daily and 600IU of vitamin D  daily.  Cervical cancer screening -     Cytology - PAP  Negative depression screening  Dysmenorrhea LLQ pain Recommend ibuprofen  use, declined hormonal therapy.  Return office for ultrasound -     US  PELVIS TRANSVAGINAL NON-OB (TV ONLY); Future   Romaine Closs, MD

## 2024-03-10 NOTE — Assessment & Plan Note (Signed)
 Cervical cancer screening performed according to ASCCP guidelines. Labs and immunizations with her primary Encouraged safe sexual practices as indicated Encouraged healthy lifestyle practices with diet and exercise For patients under 32yo, I recommend 1000mg  calcium daily and 600IU of vitamin D daily.

## 2024-03-16 ENCOUNTER — Ambulatory Visit: Payer: Self-pay | Admitting: Obstetrics and Gynecology

## 2024-04-14 ENCOUNTER — Encounter: Payer: Self-pay | Admitting: Obstetrics and Gynecology

## 2024-04-14 ENCOUNTER — Ambulatory Visit (INDEPENDENT_AMBULATORY_CARE_PROVIDER_SITE_OTHER)

## 2024-04-14 ENCOUNTER — Ambulatory Visit (INDEPENDENT_AMBULATORY_CARE_PROVIDER_SITE_OTHER): Admitting: Obstetrics and Gynecology

## 2024-04-14 VITALS — BP 110/64 | HR 75 | Temp 98.1°F | Wt 212.0 lb

## 2024-04-14 DIAGNOSIS — N946 Dysmenorrhea, unspecified: Secondary | ICD-10-CM | POA: Diagnosis not present

## 2024-04-14 DIAGNOSIS — R1032 Left lower quadrant pain: Secondary | ICD-10-CM

## 2024-04-14 NOTE — Assessment & Plan Note (Signed)
 Reviewed normal TVUS with patient today. No acute pathology. Plan for conservative management with over-the-counter medications like ibuprofen  and Tylenol . All questions answered.

## 2024-04-14 NOTE — Progress Notes (Signed)
 33 y.o. G33P2003 female with dysmenorrhea and LLQ pain, history of C-section here for TVUS. Married. Due to language barrier, an interpreter was present during the history-taking and subsequent discussion (and for part of the physical exam) with this patient.  Patient's last menstrual period was 03/27/2024 (approximate). Period Duration (Days): 4-5 Period Pattern: Regular Menstrual Flow: Moderate Menstrual Control: Tampon Dysmenorrhea: (!) Moderate Dysmenorrhea Symptoms: Nausea  At annual exam, she reported: Pelvic discharge or pain: +ROS, LLQ pain- daily (7/10), worse with menses (10/10), Today 5/10. She has not taken any medications today. She usually take Tylenol  for pain. No N/V, constipation.  Patient reports the pain is a 5-6 out of 10 however it is not affecting her quality of life or ability to work.  Sexually active: yes  Birth control: pull out, had HA with COC, pelvic pain with vaginal ring, used nexplanon- did not help pain  Gardasil: unsure    GYN HISTORY: No sig hx  OB History  Gravida Para Term Preterm AB Living  3 2 2  0 0 3  SAB IAB Ectopic Multiple Live Births  0 0 0 0 3    # Outcome Date GA Lbr Len/2nd Weight Sex Type Anes PTL Lv  3 Term 10/27/16 [redacted]w[redacted]d 01:51 / 00:41 7 lb 5.8 oz (3.34 kg) F VBAC None  LIV  2 Gravida      Vag-Spont   LIV  1 Term      CS-LTranv   LIV   History reviewed. No pertinent past medical history. Past Surgical History:  Procedure Laterality Date   CESAREAN SECTION     Current Outpatient Medications on File Prior to Visit  Medication Sig Dispense Refill   Vitamin D , Ergocalciferol , (DRISDOL ) 1.25 MG (50000 UNIT) CAPS capsule Take 1 capsule (50,000 Units total) by mouth every 7 (seven) days for 12 doses. 12 capsule 0   No current facility-administered medications on file prior to visit.    No Known Allergies  PE Today's Vitals   04/14/24 1412  BP: 110/64  Pulse: 75  Temp: 98.1 F (36.7 C)  TempSrc: Oral  SpO2: 98%   Weight: 212 lb (96.2 kg)   Body mass index is 39.09 kg/m.  Physical Exam Vitals reviewed.  Constitutional:      General: She is not in acute distress.    Appearance: Normal appearance.  HENT:     Head: Normocephalic and atraumatic.     Nose: Nose normal.  Eyes:     Extraocular Movements: Extraocular movements intact.     Conjunctiva/sclera: Conjunctivae normal.  Pulmonary:     Effort: Pulmonary effort is normal.  Musculoskeletal:        General: Normal range of motion.     Cervical back: Normal range of motion.  Neurological:     General: No focal deficit present.     Mental Status: She is alert.  Psychiatric:        Mood and Affect: Mood normal.        Behavior: Behavior normal.     04/14/24 TVUS: Indications: Left lower quadrant pain  Findings:   Uterus: 7.9 x 4.4 x 3.9 cm, anteverted uterus. Endometrial thickness: 5 mm. Left ovary: 2.3 x 1.9 x 1.9 cm, normal-appearing. Right ovary: 2.4 x 1.9 x 1.6 cm, normal-appearing. No free fluid.  Impression:  Normal transvaginal ultrasound.  Vera LULLA Pa, MD    Assessment and Plan:       Dysmenorrhea Assessment & Plan: Reviewed normal TVUS with patient today. No acute  pathology. Plan for conservative management with over-the-counter medications like ibuprofen  and Tylenol . All questions answered.   LLQ pain   As above Vera LULLA Pa, MD

## 2025-03-11 ENCOUNTER — Ambulatory Visit: Admitting: Obstetrics and Gynecology
